# Patient Record
Sex: Male | Born: 1965 | Race: Black or African American | Hispanic: No | Marital: Married | Smoking: Former smoker
Health system: Southern US, Community
[De-identification: ages and names within clinical notes are randomized; demographics above are authoritative.]

## PROBLEM LIST (undated history)

## (undated) DIAGNOSIS — E785 Hyperlipidemia, unspecified: Secondary | ICD-10-CM

## (undated) DIAGNOSIS — K5792 Diverticulitis of intestine, part unspecified, without perforation or abscess without bleeding: Secondary | ICD-10-CM

## (undated) DIAGNOSIS — K589 Irritable bowel syndrome without diarrhea: Secondary | ICD-10-CM

## (undated) DIAGNOSIS — K219 Gastro-esophageal reflux disease without esophagitis: Secondary | ICD-10-CM

## (undated) DIAGNOSIS — G4730 Sleep apnea, unspecified: Secondary | ICD-10-CM

## (undated) DIAGNOSIS — M1990 Unspecified osteoarthritis, unspecified site: Secondary | ICD-10-CM

## (undated) DIAGNOSIS — I480 Paroxysmal atrial fibrillation: Secondary | ICD-10-CM

## (undated) DIAGNOSIS — F32A Depression, unspecified: Secondary | ICD-10-CM

## (undated) DIAGNOSIS — M87052 Idiopathic aseptic necrosis of left femur: Secondary | ICD-10-CM

## (undated) DIAGNOSIS — M87051 Idiopathic aseptic necrosis of right femur: Secondary | ICD-10-CM

## (undated) HISTORY — DX: Irritable bowel syndrome, unspecified: K58.9

## (undated) HISTORY — PX: HIP SURGERY: SHX245

## (undated) HISTORY — DX: Paroxysmal atrial fibrillation: I48.0

## (undated) HISTORY — PX: TONSILLECTOMY: SUR1361

---

## 2001-10-03 ENCOUNTER — Emergency Department (HOSPITAL_COMMUNITY): Admission: EM | Admit: 2001-10-03 | Discharge: 2001-10-03 | Payer: Self-pay | Admitting: Emergency Medicine

## 2002-08-16 ENCOUNTER — Emergency Department (HOSPITAL_COMMUNITY): Admission: EM | Admit: 2002-08-16 | Discharge: 2002-08-16 | Payer: Self-pay | Admitting: Emergency Medicine

## 2006-07-06 ENCOUNTER — Ambulatory Visit: Payer: Self-pay | Admitting: Gastroenterology

## 2006-07-11 ENCOUNTER — Ambulatory Visit (HOSPITAL_COMMUNITY): Admission: RE | Admit: 2006-07-11 | Discharge: 2006-07-11 | Payer: Self-pay | Admitting: Gastroenterology

## 2006-08-14 ENCOUNTER — Ambulatory Visit (HOSPITAL_COMMUNITY): Admission: RE | Admit: 2006-08-14 | Discharge: 2006-08-14 | Payer: Self-pay | Admitting: Gastroenterology

## 2006-08-14 ENCOUNTER — Ambulatory Visit: Payer: Self-pay | Admitting: Internal Medicine

## 2006-08-14 HISTORY — PX: SIGMOIDOSCOPY: SUR1295

## 2006-09-11 ENCOUNTER — Inpatient Hospital Stay (HOSPITAL_COMMUNITY): Admission: RE | Admit: 2006-09-11 | Discharge: 2006-09-15 | Payer: Self-pay | Admitting: General Surgery

## 2006-09-11 ENCOUNTER — Encounter (INDEPENDENT_AMBULATORY_CARE_PROVIDER_SITE_OTHER): Payer: Self-pay | Admitting: General Surgery

## 2006-09-11 HISTORY — PX: LAPAROSCOPIC SIGMOID COLECTOMY: SHX5928

## 2008-05-17 ENCOUNTER — Emergency Department (HOSPITAL_COMMUNITY): Admission: EM | Admit: 2008-05-17 | Discharge: 2008-05-17 | Payer: Self-pay | Admitting: Emergency Medicine

## 2008-08-27 ENCOUNTER — Emergency Department (HOSPITAL_COMMUNITY): Admission: EM | Admit: 2008-08-27 | Discharge: 2008-08-27 | Payer: Self-pay | Admitting: Emergency Medicine

## 2008-11-03 ENCOUNTER — Encounter: Payer: Self-pay | Admitting: Pulmonary Disease

## 2008-11-05 ENCOUNTER — Encounter: Payer: Self-pay | Admitting: Pulmonary Disease

## 2008-11-28 ENCOUNTER — Ambulatory Visit: Payer: Self-pay | Admitting: Pulmonary Disease

## 2008-11-28 DIAGNOSIS — K5732 Diverticulitis of large intestine without perforation or abscess without bleeding: Secondary | ICD-10-CM | POA: Insufficient documentation

## 2008-11-28 DIAGNOSIS — K219 Gastro-esophageal reflux disease without esophagitis: Secondary | ICD-10-CM | POA: Insufficient documentation

## 2008-11-28 DIAGNOSIS — J309 Allergic rhinitis, unspecified: Secondary | ICD-10-CM | POA: Insufficient documentation

## 2008-11-28 DIAGNOSIS — J45909 Unspecified asthma, uncomplicated: Secondary | ICD-10-CM | POA: Insufficient documentation

## 2008-12-01 ENCOUNTER — Encounter: Payer: Self-pay | Admitting: Pulmonary Disease

## 2009-01-29 ENCOUNTER — Ambulatory Visit: Payer: Self-pay | Admitting: Pulmonary Disease

## 2009-05-08 ENCOUNTER — Encounter: Payer: Self-pay | Admitting: Pulmonary Disease

## 2009-11-30 ENCOUNTER — Inpatient Hospital Stay (HOSPITAL_COMMUNITY): Admission: EM | Admit: 2009-11-30 | Discharge: 2009-12-06 | Payer: Self-pay | Admitting: Emergency Medicine

## 2010-03-31 ENCOUNTER — Emergency Department (HOSPITAL_COMMUNITY)
Admission: EM | Admit: 2010-03-31 | Discharge: 2010-03-31 | Payer: Self-pay | Source: Home / Self Care | Admitting: Emergency Medicine

## 2010-04-13 NOTE — Miscellaneous (Signed)
Summary: poor refill rate for symbicort  Clinical Lists Changes  have received compliance report from [facility]. pt is not taking symbicort compliantly

## 2010-05-12 ENCOUNTER — Emergency Department (HOSPITAL_COMMUNITY)
Admission: EM | Admit: 2010-05-12 | Discharge: 2010-05-12 | Disposition: A | Discharge status: Home / Self Care | Payer: Federal, State, Local not specified - PPO | Attending: Emergency Medicine | Admitting: Emergency Medicine

## 2010-05-12 DIAGNOSIS — R002 Palpitations: Secondary | ICD-10-CM | POA: Insufficient documentation

## 2010-05-12 DIAGNOSIS — K219 Gastro-esophageal reflux disease without esophagitis: Secondary | ICD-10-CM | POA: Insufficient documentation

## 2010-05-12 DIAGNOSIS — J45909 Unspecified asthma, uncomplicated: Secondary | ICD-10-CM | POA: Insufficient documentation

## 2010-05-27 LAB — LEGIONELLA ANTIGEN, URINE: Legionella Antigen, Urine: NEGATIVE

## 2010-05-27 LAB — LIPID PANEL
Cholesterol: 219 mg/dL — ABNORMAL HIGH (ref 0–200)
Total CHOL/HDL Ratio: 3.8 RATIO
VLDL: 28 mg/dL (ref 0–40)

## 2010-05-27 LAB — DIFFERENTIAL
Basophils Absolute: 0 10*3/uL (ref 0.0–0.1)
Basophils Relative: 0 % (ref 0–1)
Basophils Relative: 0 % (ref 0–1)
Eosinophils Absolute: 0 10*3/uL (ref 0.0–0.7)
Eosinophils Absolute: 0.1 10*3/uL (ref 0.0–0.7)
Eosinophils Relative: 0 % (ref 0–5)
Eosinophils Relative: 0 % (ref 0–5)
Eosinophils Relative: 1 % (ref 0–5)
Eosinophils Relative: 9 % — ABNORMAL HIGH (ref 0–5)
Lymphocytes Relative: 26 % (ref 12–46)
Lymphocytes Relative: 9 % — ABNORMAL LOW (ref 12–46)
Lymphs Abs: 1.4 10*3/uL (ref 0.7–4.0)
Lymphs Abs: 1.6 10*3/uL (ref 0.7–4.0)
Lymphs Abs: 1.9 10*3/uL (ref 0.7–4.0)
Lymphs Abs: 1.9 10*3/uL (ref 0.7–4.0)
Lymphs Abs: 2.7 10*3/uL (ref 0.7–4.0)
Monocytes Absolute: 0.1 10*3/uL (ref 0.1–1.0)
Monocytes Absolute: 0.4 10*3/uL (ref 0.1–1.0)
Monocytes Absolute: 0.8 10*3/uL (ref 0.1–1.0)
Monocytes Relative: 1 % — ABNORMAL LOW (ref 3–12)
Monocytes Relative: 4 % (ref 3–12)
Monocytes Relative: 5 % (ref 3–12)
Neutro Abs: 11 10*3/uL — ABNORMAL HIGH (ref 1.7–7.7)
Neutro Abs: 15.6 10*3/uL — ABNORMAL HIGH (ref 1.7–7.7)
Neutro Abs: 4.2 10*3/uL (ref 1.7–7.7)
Neutrophils Relative %: 65 % (ref 43–77)
Neutrophils Relative %: 80 % — ABNORMAL HIGH (ref 43–77)
Neutrophils Relative %: 88 % — ABNORMAL HIGH (ref 43–77)

## 2010-05-27 LAB — COMPREHENSIVE METABOLIC PANEL
ALT: 27 U/L (ref 0–53)
Albumin: 4.3 g/dL (ref 3.5–5.2)
Alkaline Phosphatase: 54 U/L (ref 39–117)
BUN: 7 mg/dL (ref 6–23)
Calcium: 9.6 mg/dL (ref 8.4–10.5)
Glucose, Bld: 135 mg/dL — ABNORMAL HIGH (ref 70–99)
Potassium: 4.2 mEq/L (ref 3.5–5.1)
Sodium: 141 mEq/L (ref 135–145)
Total Protein: 7.7 g/dL (ref 6.0–8.3)

## 2010-05-27 LAB — BLOOD GAS, ARTERIAL
Acid-Base Excess: 1.8 mmol/L (ref 0.0–2.0)
Drawn by: 21694
FIO2: 0.28 %
O2 Content: 2 L/min
O2 Saturation: 94.6 %
Patient temperature: 98.6
pO2, Arterial: 67.7 mmHg — ABNORMAL LOW (ref 80.0–100.0)

## 2010-05-27 LAB — BASIC METABOLIC PANEL
BUN: 12 mg/dL (ref 6–23)
BUN: 15 mg/dL (ref 6–23)
BUN: 17 mg/dL (ref 6–23)
BUN: 7 mg/dL (ref 6–23)
CO2: 26 mEq/L (ref 19–32)
CO2: 26 mEq/L (ref 19–32)
Calcium: 8.1 mg/dL — ABNORMAL LOW (ref 8.4–10.5)
Calcium: 8.4 mg/dL (ref 8.4–10.5)
Calcium: 8.7 mg/dL (ref 8.4–10.5)
Chloride: 103 mEq/L (ref 96–112)
Chloride: 108 mEq/L (ref 96–112)
Chloride: 108 mEq/L (ref 96–112)
Creatinine, Ser: 0.92 mg/dL (ref 0.4–1.5)
Creatinine, Ser: 1.1 mg/dL (ref 0.4–1.5)
GFR calc Af Amer: >60 mL/min (ref >60)
GFR calc Af Amer: >60 mL/min (ref >60)
GFR calc non Af Amer: >60 mL/min (ref >60)
Glucose, Bld: 136 mg/dL — ABNORMAL HIGH (ref 70–99)
Glucose, Bld: 86 mg/dL (ref 70–99)
Potassium: 3.7 mEq/L (ref 3.5–5.1)
Potassium: 4 mEq/L (ref 3.5–5.1)
Sodium: 139 mEq/L (ref 135–145)
Sodium: 139 mEq/L (ref 135–145)

## 2010-05-27 LAB — GLUCOSE, CAPILLARY
Glucose-Capillary: 110 mg/dL — ABNORMAL HIGH (ref 70–99)
Glucose-Capillary: 139 mg/dL — ABNORMAL HIGH (ref 70–99)
Glucose-Capillary: 71 mg/dL (ref 70–99)
Glucose-Capillary: 90 mg/dL (ref 70–99)

## 2010-05-27 LAB — MRSA PCR SCREENING: MRSA by PCR: NEGATIVE

## 2010-05-27 LAB — CBC
HCT: 41.5 % (ref 39.0–52.0)
HCT: 43.7 % (ref 39.0–52.0)
HCT: 43.8 % (ref 39.0–52.0)
Hemoglobin: 14 g/dL (ref 13.0–17.0)
Hemoglobin: 14.6 g/dL (ref 13.0–17.0)
MCH: 30.2 pg (ref 26.0–34.0)
MCH: 30.6 pg (ref 26.0–34.0)
MCHC: 33.3 g/dL (ref 30.0–36.0)
MCHC: 33.6 g/dL (ref 30.0–36.0)
MCHC: 33.8 g/dL (ref 30.0–36.0)
MCV: 90.1 fL (ref 78.0–100.0)
MCV: 90.8 fL (ref 78.0–100.0)
MCV: 91.1 fL (ref 78.0–100.0)
MCV: 91.8 fL (ref 78.0–100.0)
Platelets: 259 10*3/uL (ref 150–400)
Platelets: 284 10*3/uL (ref 150–400)
Platelets: 300 10*3/uL (ref 150–400)
RBC: 4.19 MIL/uL — ABNORMAL LOW (ref 4.22–5.81)
RBC: 4.58 MIL/uL (ref 4.22–5.81)
RDW: 13.8 % (ref 11.5–15.5)
RDW: 13.9 % (ref 11.5–15.5)
RDW: 14.1 % (ref 11.5–15.5)
WBC: 12.5 10*3/uL — ABNORMAL HIGH (ref 4.0–10.5)
WBC: 18.1 10*3/uL — ABNORMAL HIGH (ref 4.0–10.5)
WBC: 7.1 10*3/uL (ref 4.0–10.5)

## 2010-05-27 LAB — PROTIME-INR: INR: 1 (ref 0.00–1.49)

## 2010-05-27 LAB — RAPID URINE DRUG SCREEN, HOSP PERFORMED
Benzodiazepines: NOT DETECTED
Cocaine: NOT DETECTED
Opiates: POSITIVE — AB
Tetrahydrocannabinol: NOT DETECTED

## 2010-05-27 LAB — MAGNESIUM: Magnesium: 2.3 mg/dL (ref 1.5–2.5)

## 2010-05-27 LAB — URINALYSIS, ROUTINE W REFLEX MICROSCOPIC
Bilirubin Urine: NEGATIVE
Glucose, UA: NEGATIVE mg/dL
Hgb urine dipstick: NEGATIVE
Specific Gravity, Urine: >1.03 — ABNORMAL HIGH (ref 1.005–1.030)
pH: 5.5 (ref 5.0–8.0)

## 2010-05-27 LAB — TSH: TSH: 0.59 u[IU]/mL (ref 0.350–4.500)

## 2010-06-11 ENCOUNTER — Ambulatory Visit (HOSPITAL_COMMUNITY)
Admission: RE | Admit: 2010-06-11 | Discharge: 2010-06-11 | Disposition: A | Discharge status: Home / Self Care | Payer: Federal, State, Local not specified - PPO | Source: Ambulatory Visit | Attending: Preventative Medicine | Admitting: Preventative Medicine

## 2010-06-11 ENCOUNTER — Other Ambulatory Visit (HOSPITAL_COMMUNITY): Payer: Self-pay | Admitting: Preventative Medicine

## 2010-06-11 DIAGNOSIS — R1032 Left lower quadrant pain: Secondary | ICD-10-CM | POA: Insufficient documentation

## 2010-06-11 DIAGNOSIS — R197 Diarrhea, unspecified: Secondary | ICD-10-CM | POA: Insufficient documentation

## 2010-06-11 DIAGNOSIS — M87059 Idiopathic aseptic necrosis of unspecified femur: Secondary | ICD-10-CM | POA: Insufficient documentation

## 2010-06-11 DIAGNOSIS — R110 Nausea: Secondary | ICD-10-CM

## 2010-06-11 DIAGNOSIS — R52 Pain, unspecified: Secondary | ICD-10-CM

## 2010-06-11 DIAGNOSIS — R509 Fever, unspecified: Secondary | ICD-10-CM

## 2010-06-11 DIAGNOSIS — K5730 Diverticulosis of large intestine without perforation or abscess without bleeding: Secondary | ICD-10-CM | POA: Insufficient documentation

## 2010-06-11 MED ORDER — IOHEXOL 300 MG/ML  SOLN
100.0000 mL | Freq: Once | INTRAMUSCULAR | Status: AC | PRN
  Administered 2010-06-11: 100 mL via INTRAVENOUS

## 2010-06-21 LAB — BASIC METABOLIC PANEL
Calcium: 9.8 mg/dL (ref 8.4–10.5)
Chloride: 103 mEq/L (ref 96–112)
Creatinine, Ser: 1.08 mg/dL (ref 0.4–1.5)
GFR calc Af Amer: >60 mL/min (ref >60)
Sodium: 138 mEq/L (ref 135–145)

## 2010-06-21 LAB — POCT CARDIAC MARKERS
CKMB, poc: <1 ng/mL — ABNORMAL LOW (ref 1.0–8.0)
Myoglobin, poc: 79.6 ng/mL (ref 12–200)
Troponin i, poc: <0.05 ng/mL (ref 0.00–0.09)

## 2010-06-21 LAB — CBC
Hemoglobin: 14.5 g/dL (ref 13.0–17.0)
RBC: 4.6 MIL/uL (ref 4.22–5.81)
WBC: 12.3 10*3/uL — ABNORMAL HIGH (ref 4.0–10.5)

## 2010-06-21 LAB — DIFFERENTIAL
Lymphs Abs: 3.3 10*3/uL (ref 0.7–4.0)
Monocytes Relative: 6 % (ref 3–12)
Neutro Abs: 8 10*3/uL — ABNORMAL HIGH (ref 1.7–7.7)
Neutrophils Relative %: 65 % (ref 43–77)

## 2010-07-27 NOTE — Op Note (Signed)
NAME:  Robert Gordon, Robert Gordon              ACCOUNT NO.:  [id number]   MEDICAL RECORD NO.:  [id number]          PATIENT TYPE:  INP   LOCATION:  A224                          FACILITY:  [facility]   PHYSICIAN:  Dalia Heading, M.D.  DATE OF BIRTH:  1965/12/19   DATE OF PROCEDURE:  09/11/2006  DATE OF DISCHARGE:                               OPERATIVE REPORT   PREOPERATIVE DIAGNOSIS:  Recurrent sigmoid diverticulitis.   POSTOPERATIVE DIAGNOSIS:  Recurrent sigmoid diverticulitis.   PROCEDURE:  Laparoscopic sigmoid colectomy.   SURGEON:  Dr. Franky Macho   ANESTHESIA:  General endotracheal.   INDICATIONS:  The patient is a 45 year old white male who presents with  recurrent sigmoid diverticulitis.  Risks and benefits of the procedure  including bleeding, infection, and the possibly of a blood transfusion  were fully explained to the patient, gave informed consent.   PROCEDURE NOTE:  The patient was placed in the low lithotomy position  after induction of general endotracheal anesthesia.  The abdomen was  prepped and draped in the usual sterile technique with Betadine.  Surgical site confirmation was performed.   A Pfannenstiel incision was made.  A low longitudinal incision was made  in the peritoneal cavity.  A GelPort was then inserted.  An additional  11-mm trocars placed in the suprapubic region and 5-mm trocars placed  left lower quadrant region.  The abdomen was insufflated to 16 mmHg  pressure.  The liver was inspected and noted to be within normal limits.  The nasogastric tube was noted be its appropriate position of the  stomach.  The left colon and sigmoid colon regions were mobilized along  the peritoneal reflection.  The peritoneal reflection was sharply  dissected using Endoshears as well as bluntly dissected using a hand.  The splenic flexure was taken down using Endoshears and Bovie  electrocautery.  Care was taken to avoid any injury to the spleen.  The  left ureter was  identified, retracted from the operative field.  The  mobilization was advanced fully from the splenic flexure down to the  rectum.  Once the colon was fully mobilized, it was inspected and  sigmoid diverticulosis was seen.  No diverticuli were seen in the base.  The proximal descending colon or rectal region.  The sigmoid colon was  then brought through the Pfannenstiel incision.  A GIA stapler was  placed proximally on the sigmoid colon and distally at the colorectal  juncture.  The mesentery was divided using the LigaSure.  The specimens  then removed from the operative field sent to pathology. Further  examination hardened, thickened area of mesentery was noted distally,  consistent with the patient's previous episodes of diverticulitis.  A  side-to-side colon anastomosis was then performed using a GIA stapler.  The colotomy was closed using a TA-60 stapler.  Staple line was  bolstered using 3-0 silk sutures.  Adipose tissue was then placed over  the staple line and secured in place using a 3-0 silk suture.  The  pelvis and abdominal cavity were copiously irrigated with gentamicin and  normal saline.  All operating room personnel changed  their gloves.   All fluid was then evacuated from the abdominal cavity.  All trocars  were removed.  The supraumbilical fascia was reapproximated using an 0  Vicryl interrupted suture.  The peritoneal layer was reapproximated  using a running 2-0 Vicryl suture.  The Pfannenstiel fascia was  reapproximated using 0 Vicryl interrupted sutures.  Subcutaneous layer  was irrigated normal saline and skin was closed using staples.  The  other two incisions were closed using staples.  0.5% Sensorcaine was  instilled in the surrounding incisions.  Betadine ointment and dry  sterile dressings were applied.   All tape needle counts correct at end the procedure.  The patient was  extubated in the operating room went back to recovery room awake in  stable  condition.  Complications none.   SPECIMEN:  Sigmoid colon.  Blood loss minimal.      Dalia Heading, M.D.  Electronically Signed     MAJ/MEDQ  D:  09/11/2006  T:  09/11/2006  Job:  [id number]   cc:   Samuel Jester  Fax: [phone]   Kassie Mends, M.D.  [address]  [place] , [place] [address]

## 2010-07-27 NOTE — Op Note (Signed)
NAMECHISTOPHER, MANGINO              ACCOUNT NO.:  [id number]   MEDICAL RECORD NO.:  [id number]          PATIENT TYPE:  AMB   LOCATION:  DAY                           FACILITY:  [facility]   PHYSICIAN:  Kassie Mends, M.D.      DATE OF BIRTH:  1965/08/08   DATE OF PROCEDURE:  08/14/2006  DATE OF DISCHARGE:                               OPERATIVE REPORT   REFERRING PHYSICIAN:  Dr. Samuel Jester.   PROCEDURE:  Sigmoidoscopy.   INDICATION FOR EXAM:  Robert Gordon is a 45 year old male who presents with  recurrent diverticulitis.  He continues to have abdominal pain.  He had  a with a CT scan in April 2008 which showed inflammatory changes in his  sigmoid colon.  The sigmoidoscopy is done to further evaluate these  findings.   FINDINGS:  1. Multiple diverticula seen beginning 20 cm from the anal verge and      extending to 50 cm from the anal verge.  The scope was passed to      the distal transverse colon.  Otherwise no polyps, masses,      inflammatory changes or AVMs seen.  2. Normal retroflexed view of the rectum.   RECOMMENDATIONS:  1. High fiber diet.  The patient is instructed to avoid constipation.      He is given a handout on high-fiber diet and diverticulosis.  2. He is given a prescription for Cipro and Flagyl in case he develops      a diverticulitis.  He is asked to call me prior to initiating any      antibiotic therapy.  3. He is scheduled to see Dr. Franky Macho in 7-10 days to discuss      sigmoid colectomy.  He understands that other options for managing      diverticulitis include antibiotic therapy.  He has elected to      discuss sigmoid colectomy with Dr. Franky Macho.  He prefers to      have the procedure done laparoscopically.  4. He also has diarrhea after eating.  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  He is instructed to      go to [facility] and pick up a supply of Align and take a      dose daily.   MEDICATIONS:  1. Demerol 50 mg IV.  2. Versed 5 mg IV.   PROCEDURE TECHNIQUE:  Physical exam was performed.  Informed consent was  obtained from the patient after explaining the benefits, risks and  alternatives to the procedure.  The patient connected to the monitor and  placed in the left lateral position.  Continuous oxygen was provided by  nasal cannula and IV medicine administered through an indwelling  cannula.  After administration of sedation and rectal exam, the  patient's rectum was intubated and scope was advanced under direct  visualization to the distal transverse colon.  The scope was  subsequently  withdrawn by carefully examining color, texture, anatomy and integrity  of the mucosa on the way  out.  The exam was somewhat limited by formed  stool in the descending colon and the distal transverse colon.  The  patient was recovered in endoscopy and discharged home in satisfactory  condition.      Kassie Mends, M.D.  Electronically Signed     SM/MEDQ  D:  08/14/2006  T:  08/14/2006  Job:  [id number]   cc:   Samuel Jester  Fax: [phone]

## 2010-07-27 NOTE — H&P (Signed)
NAME:  Robert Gordon, Robert Gordon              ACCOUNT NO.:  [id number]   MEDICAL RECORD NO.:  [id number]          PATIENT TYPE:  AMB   LOCATION:                                FACILITY:  [facility]   PHYSICIAN:  Dalia Heading, M.D.  DATE OF BIRTH:  03/13/1966   DATE OF ADMISSION:  08/29/2006  DATE OF DISCHARGE:  LH                              HISTORY & PHYSICAL   CHIEF COMPLAINT:  Recurrent sigmoid diverticulitis.   HISTORY OF PRESENT ILLNESS:  The patient is a 45 year old white male.  He is referred for evaluation and treatment of recurrent sigmoid  diverticulitis.  He has had three episodes over the last few years.  His  last episode was in April 2008.  A colonoscopy and CT scan of the  abdomen and pelvis revealed sigmoid diverticulitis.  He is currently  feeling okay.  No fever, chills, diarrhea or constipation have been  noted.   PAST MEDICAL HISTORY:  Extrinsic allergies.   PAST SURGICAL HISTORY:  1. Vasectomy.  2. Tonsillectomy.   CURRENT MEDICATIONS:  Albuterol nebulizer treatments.   ALLERGIES:  No known drug allergies.   REVIEW OF SYSTEMS:  The patient smokes 1/2 pack of cigarettes a day.  Denies any alcohol use.  Denies any other cardiopulmonary difficulties  or bleeding disorders.   PHYSICAL EXAMINATION:  GENERAL:  The patient is a well-developed and  well-nourished white male, in no acute distress.  LUNGS:  Clear to auscultation with good breath sounds bilaterally.  HEART:  Examination reveals a regular rate and rhythm without S3, S4 or  murmurs.  ABDOMEN:  Soft, nontender, non-distended. No hepatosplenomegaly or  herniae are identified.   IMPRESSION:  Recurrent sigmoid diverticulitis.   PLAN:  The patient is scheduled for a laparoscopic sigmoid colectomy on  September 11, 2006.  The risks and benefits of the procedure, including  bleeding, infection, pain and the possibility of a blood transfusion  were fully explained to the patient who gave an informed consent.      Dalia Heading, M.D.  Electronically Signed     MAJ/MEDQ  D:  08/29/2006  T:  08/29/2006  Job:  [id number]   cc:   Kassie Mends, M.D.  [address]  [place] , [place] [address]   Samuel Jester  Fax: [phone]

## 2010-07-27 NOTE — Discharge Summary (Signed)
NAME:  Robert Gordon, Robert Gordon NO.:  [id number]   MEDICAL RECORD NO.:  [id number]          PATIENT TYPE:  INP   LOCATION:  A319                          FACILITY:  [facility]   PHYSICIAN:  Dalia Heading, M.D.  DATE OF BIRTH:  Jul 15, 1965   DATE OF ADMISSION:  09/11/2006  DATE OF DISCHARGE:  07/04/2008LH                               DISCHARGE SUMMARY   HOSPITAL COURSE:  The patient is a 45 year old white male with a history  of recurrent sigmoid diverticulitis who presented to [facility]  for a laparoscopic sigmoid colectomy.  This was performed on 09/11/2006.  He tolerated the procedure well.  His postoperative course has been  unremarkable.  His diet was advanced without difficulty once his bowel  function returned.   The patient is being discharged home on 09/15/1998 in good and improving  condition.   DISCHARGE INSTRUCTIONS:  The patient is to follow up with Dr. Franky Macho on 09/29/2006.   DISCHARGE MEDICATIONS:  1. Include Percocet one to two tablets p.o. q.4 h. p.r.n. pain.  2. Albuterol inhalers as ordered.   PRINCIPAL DIAGNOSES:  1. Sigmoid diverticulitis.  2. Extrinsic allergies.   PRINCIPAL PROCEDURE:  Laparoscopic sigmoid colectomy on 09/11/2006.      Dalia Heading, M.D.  Electronically Signed     MAJ/MEDQ  D:  09/15/2006  T:  09/15/2006  Job:  [id number]   cc:   Kassie Mends, M.D.  [address]  [place] , [place] [address]   Samuel Jester  Fax: [phone]

## 2010-07-30 NOTE — Consult Note (Signed)
NAME:  Robert Gordon, Robert Gordon              ACCOUNT NO.:  [id number]   MEDICAL RECORD NO.:  [id number]          PATIENT TYPE:  AMB   LOCATION:                                FACILITY:  [facility]   PHYSICIAN:  Kassie Mends, M.D.      DATE OF BIRTH:  [birth year]   DATE OF CONSULTATION:  07/06/2006  DATE OF DISCHARGE:                                 CONSULTATION   GI CONSULTATION NOTE   REASON FOR CONSULTATION:  Recurrent diverticulitis.   HISTORY OF PRESENT ILLNESS:  Robert Gordon is a 45 year old gentleman who  presents for recurrent diverticulitis and to schedule colonoscopy.  He  states his first documented episode was back in June of 2007.  He  presented to the emergency department at that time with abdominal pain.  Findings were consistent with sigmoid diverticulitis.  Recently he was  seen by his PCP in March and was complaining of recurrent abdominal  pain.  He is empirically treated with a 10 day course of Cipro.  He  states the symptoms really have not resolved.  He continues to have  abdominal bloating, left lower quadrant abdominal pain and constipation.  Denies any melena or rectal bleeding.  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  About 1 year ago his bowel movements changed and now he is  having post-prandial bloating.  He has very small hard stools 1-2 times  a day, and maybe every third day would have what he considers a good  bowel movement with relief of his bloating.  Again for the past six  weeks he has intermittent left lower quadrant abdominal pain, no fever  or chills, having increased difficulty with constipation.  He has a  history of GERD, rarely has any heartburn.  He is on Pantoprazole.  He  has never had a colonoscopy.  He states he was seen by [place]. Laure[place] his June 2007 attack of diverticulitis and  was going to be scheduled for a colonoscopy but she then transferred out  of town.   CURRENT MEDICATIONS:  Ipratropium/albuterol nebulizer b.i.d.  Symbicort p.r.n.  Albuterol p.r.n.  Singulair 10 mg q.h.s.  Zyrtec 10 mg once a day.  Pantoprazole 40 mg once a day.  Fluticasone nasal spray daily.   ALLERGIES:  No known drug allergies.   PAST MEDICAL HISTORY:  Allergies, history of diverticulitis, history of  asthma.  He was hospitalized in December 2007 for a severe asthma  attack.   PAST SURGICAL HISTORY:  He has had a vasectomy, tonsillectomy, surgery  for bone spur.   FAMILY HISTORY:  Negative for colorectal cancer or colon polyps.   SOCIAL HISTORY:  He has been married for 10 years.  He has two  daughters, two sons and a Museum/gallery conservator.  He works for the [employer].  He smokes less than a half pack of cigarettes daily.  Occasional he consumes alcohol.  He is retired from [employer]  He served  for over 16 years.   REVIEW OF SYSTEMS:  See HPI for GI consultation.  Denies weight loss.  Cardiopulmonary:  No chest pain or shortness of breath, palpitations,  chronic cough.  GU:  No dysuria or hematuria.   PHYSICAL EXAMINATION:  Weight 198.  Height 5 foot 9.  Temp 98.5.  Blood  pressure 138/82.  Pulse 95.  General:  Pleasant, well-nourished, well-  developed gentleman in no acute distress.  Skin warm and dry, no  jaundice.  HEENT:  Sclera nonicteric.  Oropharyngeal mucosa moist and  pink.  No lesions, erythema or exudate.  No lymphadenopathy or  thyromegaly.  Chest:  Lungs are clear to auscultation.  Cardiac exam  reveals regular rate and rhythm.  Normal S1, S2.  No murmurs, rubs or  gallops.  Abdomen:  Positive bowel sounds.  Abdomen is soft,  nondistended.  He has mild tenderness throughout the lower mid to left  lower quadrant to deep palpation.  No organomegaly or masses.  No  rebound tenderness or guarding.  No abdominal bruits or hernias.  Extremities:  No  edema.   IMPRESSION:  Robert Gordon is a 45 year old gentleman with at least one  documented episode of sigmoid diverticulitis in June 2007.  He has had  symptoms over the last one year of postprandial abdominal bloating or  constipation.  In March of this year he had recurrent left lower  quadrant abdominal pain which really has not resolved after a 10 day  course of Cipro and IM Rocephin (single dose).  I have discussed the  case with Dr. Cira Servant.  We will go ahead and repeat his CT to rule out any  recurrent or small draining diverticulitis or even an abscess.  We will  plan on colonoscopy after that.   PLAN:  1. CBC MET-7.  2. CT abdomen and pelvis with IV normal contrast.  3. He will have a colonoscopy at a later date after initial workup has      been done.   I would like to thank Dr. Samuel Jester for allowing [place] to take part in  the care of this patient.      Tana Coast, P.A.      Kassie Mends, M.D.  Electronically Signed    LL/MEDQ  D:  07/06/2006  T:  07/06/2006  Job:  [id number]   cc:   Samuel Jester  Fax: [phone]

## 2010-09-24 ENCOUNTER — Emergency Department (HOSPITAL_COMMUNITY)
Admission: EM | Admit: 2010-09-24 | Discharge: 2010-09-24 | Disposition: A | Discharge status: Home / Self Care | Payer: Federal, State, Local not specified - PPO | Attending: Emergency Medicine | Admitting: Emergency Medicine

## 2010-09-24 DIAGNOSIS — J45909 Unspecified asthma, uncomplicated: Secondary | ICD-10-CM | POA: Insufficient documentation

## 2010-09-24 DIAGNOSIS — F17200 Nicotine dependence, unspecified, uncomplicated: Secondary | ICD-10-CM | POA: Insufficient documentation

## 2010-09-24 HISTORY — DX: Diverticulitis of intestine, part unspecified, without perforation or abscess without bleeding: K57.92

## 2010-09-24 HISTORY — DX: Gastro-esophageal reflux disease without esophagitis: K21.9

## 2010-09-24 HISTORY — DX: Unspecified osteoarthritis, unspecified site: M19.90

## 2010-09-24 MED ORDER — ALBUTEROL SULFATE HFA 108 (90 BASE) MCG/ACT IN AERS: 1.0000 | INHALATION_SPRAY | Freq: Four times a day (QID) | RESPIRATORY_TRACT | Status: DC | PRN

## 2010-09-24 MED ORDER — ALBUTEROL SULFATE (5 MG/ML) 0.5% IN NEBU
2.5000 mg | INHALATION_SOLUTION | Freq: Once | RESPIRATORY_TRACT | Status: AC
  Administered 2010-09-24: 2.5 mg via RESPIRATORY_TRACT

## 2010-09-24 MED ORDER — PREDNISONE 20 MG PO TABS
60.0000 mg | ORAL_TABLET | Freq: Once | ORAL | Status: AC
  Administered 2010-09-24: 60 mg via ORAL
  Filled 2010-09-24: qty 3

## 2010-09-24 MED ORDER — IPRATROPIUM BROMIDE 0.02 % IN SOLN
0.5000 mg | Freq: Once | RESPIRATORY_TRACT | Status: AC
  Administered 2010-09-24: 0.5 mg via RESPIRATORY_TRACT
  Filled 2010-09-24: qty 2.5

## 2010-09-24 MED ORDER — PREDNISONE 10 MG PO TABS: 20.0000 mg | ORAL_TABLET | Freq: Every day | ORAL | Status: AC

## 2010-09-24 MED ORDER — ALBUTEROL SULFATE (2.5 MG/3ML) 0.083% IN NEBU
INHALATION_SOLUTION | RESPIRATORY_TRACT | Status: AC
  Administered 2010-09-24: 2.5 mg via RESPIRATORY_TRACT
  Filled 2010-09-24: qty 3

## 2010-09-24 NOTE — ED Provider Notes (Addendum)
History     Chief Complaint  Patient presents with  . Asthma   Patient is a 45 y.o. male presenting with asthma. The history is provided by the patient and the spouse.  Asthma This is a recurrent problem. The current episode started more than 2 days ago. The problem occurs daily. The problem has been gradually worsening. Associated symptoms include chest pain and shortness of breath. Pertinent negatives include no abdominal pain and no headaches. The symptoms are aggravated by nothing. The symptoms are relieved by nothing (WAS TRYING NEBULIZER AT HOME BUT NOT HELPING). The treatment provided no relief.   HAS HOME NUBULIZER BUT NOT HELPING OUT OF INHALER. USUSALLY NEEDS STEROIDS. [facility] KNOWN HX OF ASTHMA.  Past Medical History  Diagnosis Date  . Asthma   . Diverticulitis   . GERD (gastroesophageal reflux disease)   . Arthritis     Past Surgical History  Procedure Date  . Tonsillectomy   . Sigmoid resection / rectopexy     No family history on file.  History  Substance Use Topics  . Smoking status: Current Everyday Smoker    Types: Cigarettes  . Smokeless tobacco: Not on file  . Alcohol Use: No      Review of Systems  Constitutional: Negative for fever.  HENT: Negative for congestion, sneezing and neck pain.   Eyes: Negative for redness.  Respiratory: Positive for chest tightness, shortness of breath and wheezing.   Cardiovascular: Positive for chest pain. Negative for palpitations.  Gastrointestinal: Negative for nausea, vomiting and abdominal pain.  Genitourinary: Negative for hematuria.  Musculoskeletal: Negative for back pain.  Skin: Negative for rash.  Neurological: Negative for headaches.  Hematological: Negative for adenopathy.    Physical Exam  BP 118/80  Pulse 96  Temp(Src) 97.6 F (36.4 C) (Oral)  Resp 26  Ht 5\' 9"  (1.753 m)  Wt 212 lb (96.163 kg)  BMI 31.31 kg/m2  SpO2 99%  Physical Exam  Constitutional: He is oriented to person,  place, and time. He appears well-developed and well-nourished.  HENT:  Head: Normocephalic and atraumatic.  Mouth/Throat: Oropharynx is clear and moist.  Eyes: Conjunctivae and EOM are normal. Pupils are equal, round, and reactive to light.  Neck: Normal range of motion. Neck supple.  Cardiovascular: Normal rate, regular rhythm and normal heart sounds.   No murmur heard. Pulmonary/Chest: He is in respiratory distress. He has wheezes. He has no rales. He exhibits no tenderness.  Abdominal: Soft. Bowel sounds are normal. There is no tenderness.  Musculoskeletal: Normal range of motion. He exhibits no edema.  Lymphadenopathy:    He has no cervical adenopathy.  Neurological: He is alert and oriented to person, place, and time. No cranial nerve deficit.  Skin: Skin is warm and dry. No rash noted.    ED Course  Procedures  MDM RX IN ED WIT NEBULIZER WIT ATROVENT AND ALBUTEROL AND WHEEZING RESOLVED. ALSO GIVEN PREDNISONE.       Shelda Jakes, MD 09/24/10 0454  Shelda Jakes, MD 09/24/10 [id number]

## 2010-09-24 NOTE — ED Notes (Signed)
Has been sick w. Asthma since last sat.

## 2010-12-28 LAB — BASIC METABOLIC PANEL
BUN: 6
CO2: 29
Calcium: 8.4
Creatinine, Ser: 0.91
GFR calc non Af Amer: >60
Glucose, Bld: 103 — ABNORMAL HIGH
Sodium: 141

## 2010-12-28 LAB — CBC
MCHC: 34
Platelets: 204
RDW: 13.4

## 2010-12-28 LAB — DIFFERENTIAL
Basophils Absolute: 0
Basophils Relative: 0
Lymphocytes Relative: 23
Monocytes Absolute: 0.8 — ABNORMAL HIGH
Neutro Abs: 6.5
Neutrophils Relative %: 67

## 2010-12-29 LAB — BASIC METABOLIC PANEL
BUN: 7
Chloride: 107
GFR calc Af Amer: >60
GFR calc non Af Amer: >60
Potassium: 3.8

## 2010-12-29 LAB — CBC
HCT: 45.5
MCV: 88.6
Platelets: 229
RBC: 5.14
WBC: 6.8

## 2010-12-29 LAB — TYPE AND SCREEN: ABO/RH(D): O POS

## 2011-01-09 ENCOUNTER — Emergency Department (HOSPITAL_COMMUNITY)
Admission: EM | Admit: 2011-01-09 | Discharge: 2011-01-09 | Disposition: A | Discharge status: Home / Self Care | Payer: Federal, State, Local not specified - PPO | Attending: Emergency Medicine | Admitting: Emergency Medicine

## 2011-01-09 ENCOUNTER — Encounter (HOSPITAL_COMMUNITY): Payer: Self-pay

## 2011-01-09 DIAGNOSIS — M129 Arthropathy, unspecified: Secondary | ICD-10-CM | POA: Insufficient documentation

## 2011-01-09 DIAGNOSIS — K219 Gastro-esophageal reflux disease without esophagitis: Secondary | ICD-10-CM | POA: Insufficient documentation

## 2011-01-09 DIAGNOSIS — F17200 Nicotine dependence, unspecified, uncomplicated: Secondary | ICD-10-CM | POA: Insufficient documentation

## 2011-01-09 DIAGNOSIS — J45901 Unspecified asthma with (acute) exacerbation: Secondary | ICD-10-CM

## 2011-01-09 DIAGNOSIS — J45909 Unspecified asthma, uncomplicated: Secondary | ICD-10-CM | POA: Insufficient documentation

## 2011-01-09 MED ORDER — SODIUM CHLORIDE 0.9 % IN NEBU
INHALATION_SOLUTION | RESPIRATORY_TRACT | Status: AC
  Administered 2011-01-09: 3 mL
  Filled 2011-01-09: qty 15

## 2011-01-09 MED ORDER — METHYLPREDNISOLONE SODIUM SUCC 125 MG IJ SOLR
125.0000 mg | Freq: Once | INTRAMUSCULAR | Status: AC
  Administered 2011-01-09: 125 mg via INTRAMUSCULAR
  Filled 2011-01-09: qty 2

## 2011-01-09 MED ORDER — PREDNISONE 10 MG PO TABS: 20.0000 mg | ORAL_TABLET | Freq: Every day | ORAL | Status: AC

## 2011-01-09 MED ORDER — ALBUTEROL (5 MG/ML) CONTINUOUS INHALATION SOLN
15.0000 mg | INHALATION_SOLUTION | RESPIRATORY_TRACT | Status: AC
  Administered 2011-01-09: 15 mg via RESPIRATORY_TRACT
  Filled 2011-01-09: qty 20

## 2011-01-09 MED ORDER — IPRATROPIUM BROMIDE 0.02 % IN SOLN
0.5000 mg | Freq: Once | RESPIRATORY_TRACT | Status: AC
  Administered 2011-01-09: 0.5 mg via RESPIRATORY_TRACT
  Filled 2011-01-09: qty 2.5

## 2011-01-09 MED ORDER — PREDNISONE 10 MG PO TABS: ORAL_TABLET | ORAL | Status: DC

## 2011-01-09 NOTE — ED Notes (Signed)
Pt states his asthma has been acting up since last night. Pt states he is short of breath and his breathing treatments at home are not working. Pt is wheezing audibly.

## 2011-01-09 NOTE — ED Notes (Signed)
Pt presents with SOB and  wheezing. Pt has tried to take inhalers, nebs, prednisone and all have worked for a short time. Today nothing is working. NAD at this time.

## 2011-01-09 NOTE — ED Provider Notes (Signed)
History     CSN: [id number] Arrival date & time: 01/09/2011  1:06 PM   First MD Initiated Contact with Patient 01/09/11 1421      Chief Complaint  Patient presents with  . Shortness of Breath  . Wheezing  . Asthma    (Consider location/radiation/quality/duration/timing/severity/associated sxs/prior treatment) HPI  Patient relates he has a history of reactive airway disease and he states 2 nights ago he started having trouble breathing and felt okay yesterday however he had difficulty last night from midnight to 4:30 AM then he was able to rest he states it and then he started getting more short of breath. He took a couple prednisone at home he does not know the milligram strength of the prednisone. He denies coughing fever rhinorrhea sore throat vomiting or diarrhea. He states nothing makes them feel better. He states today his nebulizer is not helping. He states he feels very short of breath.   Past Medical History  Diagnosis Date  . Asthma   . Diverticulitis   . GERD (gastroesophageal reflux disease)   . Arthritis     Past Surgical History  Procedure Date  . Tonsillectomy   . Sigmoid resection / rectopexy     History reviewed. No pertinent family history.  History  Substance Use Topics  . Smoking status: Current Some Day Smoker    Types: Cigarettes  . Smokeless tobacco: Not on file  . Alcohol Use: No   patient employed    Review of Systems  All other systems reviewed and are negative.    Allergies  Dm  Home Medications   Current Outpatient Rx  Name Route Sig Dispense Refill  . ALBUTEROL SULFATE HFA 108 (90 BASE) MCG/ACT IN AERS Inhalation Inhale 1-2 puffs into the lungs every 6 (six) hours as needed for wheezing. 1 Inhaler 0  . ALBUTEROL SULFATE (2.5 MG/3ML) 0.083% IN NEBU Nebulization Take 2.5 mg by nebulization every 6 (six) hours as needed. For shortness of breath    . BUDESONIDE-FORMOTEROL FUMARATE 160-4.5 MCG/ACT IN AERO Inhalation Inhale 2 puffs  into the lungs 2 (two) times daily. Pt currently out of medication     . DIPHENHYDRAMINE HCL 25 MG PO CAPS Oral Take 25 mg by mouth every 6 (six) hours as needed. For allergies     . PRIMATENE ASTHMA PO Oral Take 2 tablets by mouth every 8 (eight) hours as needed. For shortness of breath     . GUAIFENESIN 100 MG/5ML PO SOLN Oral Take 5 mLs by mouth every 4 (four) hours as needed. For cough     . IBUPROFEN 200 MG PO TABS Oral Take 400 mg by mouth every 6 (six) hours as needed. For pain     . PREDNISONE 10 MG PO TABS Oral Take 2 tablets (20 mg total) by mouth daily. 10 tablet 0  . PREDNISONE 10 MG PO TABS  Take 3 po QD x 2d starting tomorrow, then 2 po QD x 3d then 1 po QD x 3d  15 tablet 0  . UNABLE TO FIND        BP 111/80  Pulse 101  Temp(Src) 97.9 F (36.6 C) (Oral)  Resp 20  Ht 5\' 9"  (1.753 m)  Wt 194 lb (87.998 kg)  BMI 28.65 kg/m2  SpO2 97%  Vital signs normal except for mild tachycardia  Physical Exam  Constitutional: He is oriented to person, place, and time. He appears well-developed and well-nourished.  HENT:  Head: Normocephalic and atraumatic.  Nose: Nose  normal.  Mouth/Throat: Oropharynx is clear and moist.  Eyes: Conjunctivae and EOM are normal. Pupils are equal, round, and reactive to light.  Neck: Normal range of motion. Neck supple.  Cardiovascular: Normal rate, regular rhythm and normal heart sounds.   Pulmonary/Chest:       She is noted to appear to tachypneic  which is gets worse when he starts talking. He is known to have some mild retractions. He is noted to have decreased breath sounds diffusely bilaterally and he has expiratory wheezing. He sometimes has audible wheezing  Neurological: He is alert and oriented to person, place, and time. He has normal reflexes.  Skin: Skin is warm and dry.  Psychiatric: He has a normal mood and affect. His behavior is normal.    ED Course  Procedures (including critical care time)   Patient received a continuous  nebulizer with albuterol 15 mg plus Atrovent 0.5 mg on recheck he has much improved air movement and no wheezing. He was given Solu-Medrol 125 mg IM per patient request. He states he feels ready to go home.  1. Asthma attack     Medications  predniSONE (DELTASONE) 10 MG tablet (not administered)  methylPREDNISolone sodium succinate (SOLU-MEDROL) 125 MG injection 125 mg (125 mg Intramuscular Given 01/09/11 1503)  ipratropium (ATROVENT) nebulizer solution 0.5 mg (0.5 mg Nebulization Given 01/09/11 1538)  sodium chloride 0.9 % nebulizer solution (3 mL  Given 01/09/11 1538)   Devoria Albe, MD, FACEP    MDM          Ward Givens, MD 01/09/11 1800

## 2011-01-16 ENCOUNTER — Other Ambulatory Visit: Payer: Self-pay

## 2011-01-16 ENCOUNTER — Encounter (HOSPITAL_COMMUNITY): Payer: Self-pay | Admitting: *Deleted

## 2011-01-16 ENCOUNTER — Emergency Department (HOSPITAL_COMMUNITY)
Admission: EM | Admit: 2011-01-16 | Discharge: 2011-01-16 | Disposition: A | Discharge status: Home / Self Care | Payer: Federal, State, Local not specified - PPO | Attending: Emergency Medicine | Admitting: Emergency Medicine

## 2011-01-16 ENCOUNTER — Emergency Department (HOSPITAL_COMMUNITY): Payer: Federal, State, Local not specified - PPO

## 2011-01-16 DIAGNOSIS — J45909 Unspecified asthma, uncomplicated: Secondary | ICD-10-CM | POA: Insufficient documentation

## 2011-01-16 DIAGNOSIS — K219 Gastro-esophageal reflux disease without esophagitis: Secondary | ICD-10-CM | POA: Insufficient documentation

## 2011-01-16 DIAGNOSIS — R0682 Tachypnea, not elsewhere classified: Secondary | ICD-10-CM | POA: Insufficient documentation

## 2011-01-16 DIAGNOSIS — F17200 Nicotine dependence, unspecified, uncomplicated: Secondary | ICD-10-CM | POA: Insufficient documentation

## 2011-01-16 DIAGNOSIS — M129 Arthropathy, unspecified: Secondary | ICD-10-CM | POA: Insufficient documentation

## 2011-01-16 DIAGNOSIS — J45901 Unspecified asthma with (acute) exacerbation: Secondary | ICD-10-CM

## 2011-01-16 LAB — D-DIMER, QUANTITATIVE: D-Dimer, Quant: 0.24 ug/mL-FEU (ref 0.00–0.48)

## 2011-01-16 MED ORDER — AZITHROMYCIN 250 MG PO TABS: 250.0000 mg | ORAL_TABLET | Freq: Every day | ORAL | Status: AC

## 2011-01-16 MED ORDER — IPRATROPIUM BROMIDE 0.02 % IN SOLN
0.5000 mg | Freq: Once | RESPIRATORY_TRACT | Status: AC
  Administered 2011-01-16: 0.5 mg via RESPIRATORY_TRACT
  Filled 2011-01-16: qty 2.5

## 2011-01-16 MED ORDER — ALBUTEROL SULFATE (5 MG/ML) 0.5% IN NEBU
INHALATION_SOLUTION | RESPIRATORY_TRACT | Status: AC
  Filled 2011-01-16: qty 0.5

## 2011-01-16 MED ORDER — PREDNISONE 20 MG PO TABS
60.0000 mg | ORAL_TABLET | Freq: Once | ORAL | Status: AC
  Administered 2011-01-16: 60 mg via ORAL
  Filled 2011-01-16: qty 3

## 2011-01-16 MED ORDER — ALBUTEROL SULFATE (5 MG/ML) 0.5% IN NEBU
2.5000 mg | INHALATION_SOLUTION | Freq: Once | RESPIRATORY_TRACT | Status: AC
  Administered 2011-01-16: 2.5 mg via RESPIRATORY_TRACT
  Filled 2011-01-16: qty 0.5

## 2011-01-16 MED ORDER — ALBUTEROL SULFATE (5 MG/ML) 0.5% IN NEBU: 2.5000 mg | INHALATION_SOLUTION | Freq: Once | RESPIRATORY_TRACT | Status: DC

## 2011-01-16 MED ORDER — ALBUTEROL SULFATE (5 MG/ML) 0.5% IN NEBU
2.5000 mg | INHALATION_SOLUTION | Freq: Once | RESPIRATORY_TRACT | Status: AC
  Administered 2011-01-16: 2.5 mg via RESPIRATORY_TRACT

## 2011-01-16 MED ORDER — PREDNISONE 50 MG PO TABS: ORAL_TABLET | ORAL | Status: AC

## 2011-01-16 MED ORDER — ALBUTEROL SULFATE HFA 108 (90 BASE) MCG/ACT IN AERS: 2.0000 | INHALATION_SPRAY | RESPIRATORY_TRACT | Status: DC | PRN

## 2011-01-16 NOTE — ED Notes (Signed)
Pt was seen in the ed this past Sunday. Pt states he felt better at discharge Sunday night but woke Monday morning sob. Pt states he could not go to work on Wednesday. Pt states it is difficult to get full breath.

## 2011-01-16 NOTE — ED Notes (Signed)
Received report pt assessment unchanged LS wheezy though out bilateral lung fields; labored on exertion; No shortness of breath noted at rest

## 2011-01-16 NOTE — ED Provider Notes (Signed)
History    Scribed for Glynn Octave, MD, the patient was seen in room APA01/APA01. This chart was scribed by Katha Cabal.   CSN: [id number] Arrival date & time: 01/16/2011  5:33 PM   None     Chief Complaint  Patient presents with  . Asthma    (Consider location/radiation/quality/duration/timing/severity/associated sxs/prior treatment) HPI JHALIL SILVERA is a 45 y.o. male who presents to the Emergency Department complaining of new asthma episode.  Patient complains of persistent intermittent SOB, cough, sneezing, chest pain and abdominal bloating for past week.  Patient was recently evaluated in ED a week ago for similar symptoms. Patient reports recent sick contacts at home with cough and sneezing.  Patient took steroids and nebulizer treatment with mild relief. Patient took hydrocodone for pain.   Symptoms are gradually worsening.  Patient was admitted for similar asthma episodes in the past.  Patient with history of GERD and diverticulitis.     Past Medical History  Diagnosis Date  . Asthma   . Diverticulitis   . GERD (gastroesophageal reflux disease)   . Arthritis     Past Surgical History  Procedure Date  . Tonsillectomy   . Sigmoid resection / rectopexy     No family history on file.  History  Substance Use Topics  . Smoking status: Current Some Day Smoker    Types: Cigarettes  . Smokeless tobacco: Not on file  . Alcohol Use: No      Review of Systems  All other systems reviewed and are negative.    Allergies  Dm  Home Medications   Current Outpatient Rx  Name Route Sig Dispense Refill  . ALBUTEROL SULFATE HFA 108 (90 BASE) MCG/ACT IN AERS Inhalation Inhale 1-2 puffs into the lungs every 6 (six) hours as needed for wheezing. 1 Inhaler 0  . ALBUTEROL SULFATE (2.5 MG/3ML) 0.083% IN NEBU Nebulization Take 2.5 mg by nebulization every 6 (six) hours as needed. For shortness of breath    . BENZONATATE 100 MG PO CAPS Oral Take 100 mg by mouth 2 (two)  times daily as needed.      . BUDESONIDE-FORMOTEROL FUMARATE 160-4.5 MCG/ACT IN AERO Inhalation Inhale 2 puffs into the lungs 2 (two) times daily. Pt currently out of medication     . DIPHENHYDRAMINE HCL 25 MG PO CAPS Oral Take 25 mg by mouth every 6 (six) hours as needed. For allergies     . PRIMATENE ASTHMA PO Oral Take 2 tablets by mouth every 8 (eight) hours as needed. For shortness of breath     . GUAIFENESIN 100 MG/5ML PO SOLN Oral Take 5 mLs by mouth every 4 (four) hours as needed. For cough     . IBUPROFEN 200 MG PO TABS Oral Take 400 mg by mouth every 6 (six) hours as needed. For pain     . PREDNISONE 10 MG PO TABS Oral Take 2 tablets (20 mg total) by mouth daily. 10 tablet 0  . PREDNISONE 10 MG PO TABS  Take 3 po QD x 2d starting tomorrow, then 2 po QD x 3d then 1 po QD x 3d  15 tablet 0    BP 121/78  Pulse 105  Temp(Src) 97.5 F (36.4 C) (Oral)  Resp 19  Ht 5\' 9"  (1.753 m)  Wt 194 lb (87.998 kg)  BMI 28.65 kg/m2  SpO2 98%  Physical Exam  Constitutional: He is oriented to person, place, and time. He appears well-developed and well-nourished. No distress.  HENT:  Head:  Normocephalic and atraumatic.  Mouth/Throat: Oropharynx is clear and moist.  Eyes: EOM are normal. Pupils are equal, round, and reactive to light.  Neck: Neck supple.  Cardiovascular: Normal rate and regular rhythm.   Pulmonary/Chest: Tachypnea noted. No respiratory distress. He has wheezes.       Diffuse wheezing, good air exchange, tachypnea that gets worse with talking   Abdominal: Soft. There is no tenderness.  Musculoskeletal: Normal range of motion.  Neurological: He is alert and oriented to person, place, and time.  Skin: Skin is warm and dry.  Psychiatric: He has a normal mood and affect. His behavior is normal.    ED Course  Procedures (including critical care time)   DIAGNOSTIC STUDIES: Oxygen Saturation is 97% on room air, normal by my interpretation.  Will order CRX and breathing  treatment.   COORDINATION OF CARE:   Orders Placed This Encounter  Procedures  . DG Chest 2 View  . D-dimer, quantitative  . Check Pulse Oximetry while ambulating  . ED EKG     LABS / RADIOLOGY:    Labs Reviewed  D-DIMER, QUANTITATIVE   Dg Chest 2 View  01/16/2011  *RADIOLOGY REPORT*  Clinical Data: Short of breath  CHEST - 2 VIEW  Comparison: 12/02/2009  Findings: Stable bronchitic changes.  Normal heart size.  No peripheral consolidation.  No pneumothorax or pleural effusion. Mild hyperaeration.  IMPRESSION: Bronchitic changes and hyperaeration without consolidation.  Original Report Authenticated By: Donavan Burnet, M.D.         MDM   MDM: asthma exacerbation, recent tapering of steroids with SOB, wheezing, cough.  No hypoxia, mild tachypnea.  Improvement in wheezing air exchange after given them. Patient speaking full-size is 97% room air. I will dose additional albuterol and trial ambulation. No focal infiltrate on chest x-ray.  Ambulated in ED without desaturation.  Lungs CTA.     MEDICATIONS GIVEN IN THE E.D. Scheduled Meds:    . albuterol  2.5 mg Nebulization Once  . albuterol  2.5 mg Nebulization Once  . albuterol  2.5 mg Nebulization Once  . albuterol      . ipratropium  0.5 mg Nebulization Once  . predniSONE  60 mg Oral Once   Continuous Infusions:      IMPRESSION: No diagnosis found.   I personally performed the services described in this documentation, which was scribed in my presence.  The recorded information has been reviewed and considered.           Glynn Octave, MD 01/16/11 2029

## 2011-01-16 NOTE — ED Notes (Signed)
Pt stable at discharge and ambulatory without complaints

## 2011-01-16 NOTE — ED Notes (Signed)
Pt ambulated in hall without 02 sats 96% wheezing noted with shortness of breath however pt tolerated ambulation without problems

## 2011-01-29 ENCOUNTER — Emergency Department (HOSPITAL_COMMUNITY)
Admission: EM | Admit: 2011-01-29 | Discharge: 2011-01-29 | Disposition: A | Discharge status: Home / Self Care | Payer: Federal, State, Local not specified - PPO | Attending: Emergency Medicine | Admitting: Emergency Medicine

## 2011-01-29 ENCOUNTER — Encounter (HOSPITAL_COMMUNITY): Payer: Self-pay

## 2011-01-29 DIAGNOSIS — J45909 Unspecified asthma, uncomplicated: Secondary | ICD-10-CM

## 2011-01-29 MED ORDER — PREDNISONE 10 MG PO TABS: ORAL_TABLET | ORAL | Status: DC

## 2011-01-29 MED ORDER — PREDNISONE 20 MG PO TABS
60.0000 mg | ORAL_TABLET | Freq: Once | ORAL | Status: AC
  Administered 2011-01-29: 60 mg via ORAL
  Filled 2011-01-29: qty 3

## 2011-01-29 MED ORDER — ALBUTEROL (5 MG/ML) CONTINUOUS INHALATION SOLN
5.0000 mg/h | INHALATION_SOLUTION | Freq: Once | RESPIRATORY_TRACT | Status: DC
  Filled 2011-01-29: qty 20

## 2011-01-29 MED ORDER — ALBUTEROL (5 MG/ML) CONTINUOUS INHALATION SOLN
10.0000 mg | INHALATION_SOLUTION | RESPIRATORY_TRACT | Status: AC
  Administered 2011-01-29: 10 mg via RESPIRATORY_TRACT

## 2011-01-29 MED ORDER — HYDROCOD POLST-CHLORPHEN POLST 10-8 MG/5ML PO LQCR
5.0000 mL | Freq: Once | ORAL | Status: AC
  Administered 2011-01-29: 5 mL via ORAL
  Filled 2011-01-29: qty 5

## 2011-01-29 MED ORDER — ALBUTEROL SULFATE (5 MG/ML) 0.5% IN NEBU
INHALATION_SOLUTION | RESPIRATORY_TRACT | Status: AC
  Administered 2011-01-29: 5 mg
  Filled 2011-01-29: qty 1

## 2011-01-29 MED ORDER — IPRATROPIUM BROMIDE 0.02 % IN SOLN
0.5000 mg | Freq: Once | RESPIRATORY_TRACT | Status: AC
  Administered 2011-01-29: 0.5 mg via RESPIRATORY_TRACT
  Filled 2011-01-29: qty 2.5

## 2011-01-29 MED ORDER — PROMETHAZINE-CODEINE 6.25-10 MG/5ML PO SYRP: 5.0000 mL | ORAL_SOLUTION | ORAL | Status: AC | PRN

## 2011-01-29 MED ORDER — PROMETHAZINE-CODEINE 6.25-10 MG/5ML PO SYRP: 5.0000 mL | ORAL_SOLUTION | ORAL | Status: DC | PRN

## 2011-01-29 MED ORDER — ALBUTEROL SULFATE (5 MG/ML) 0.5% IN NEBU
5.0000 mg | INHALATION_SOLUTION | Freq: Once | RESPIRATORY_TRACT | Status: AC
  Administered 2011-01-29: 2.5 mg via RESPIRATORY_TRACT
  Filled 2011-01-29: qty 1

## 2011-01-29 NOTE — ED Notes (Signed)
Pt reports has been trying to deal with asthma exacerbation at home since Friday but is not getting any better.  Has been using albuterol inhaler and nebulizer.  Last neb treatment was ago.  Has had 2 breathing treatments at home today.

## 2011-02-05 ENCOUNTER — Emergency Department (HOSPITAL_COMMUNITY): Payer: Federal, State, Local not specified - PPO

## 2011-02-05 ENCOUNTER — Encounter (HOSPITAL_COMMUNITY): Payer: Self-pay | Admitting: *Deleted

## 2011-02-05 ENCOUNTER — Emergency Department (HOSPITAL_COMMUNITY)
Admission: EM | Admit: 2011-02-05 | Discharge: 2011-02-05 | Disposition: A | Discharge status: Home / Self Care | Payer: Federal, State, Local not specified - PPO | Attending: Emergency Medicine | Admitting: Emergency Medicine

## 2011-02-05 DIAGNOSIS — R05 Cough: Secondary | ICD-10-CM | POA: Insufficient documentation

## 2011-02-05 DIAGNOSIS — R059 Cough, unspecified: Secondary | ICD-10-CM | POA: Insufficient documentation

## 2011-02-05 DIAGNOSIS — J45901 Unspecified asthma with (acute) exacerbation: Secondary | ICD-10-CM | POA: Insufficient documentation

## 2011-02-05 DIAGNOSIS — R0602 Shortness of breath: Secondary | ICD-10-CM | POA: Insufficient documentation

## 2011-02-05 MED ORDER — IPRATROPIUM BROMIDE 0.02 % IN SOLN
0.5000 mg | Freq: Once | RESPIRATORY_TRACT | Status: AC
  Administered 2011-02-05: 0.5 mg via RESPIRATORY_TRACT
  Filled 2011-02-05: qty 2.5

## 2011-02-05 MED ORDER — ALBUTEROL SULFATE (5 MG/ML) 0.5% IN NEBU
2.5000 mg | INHALATION_SOLUTION | Freq: Once | RESPIRATORY_TRACT | Status: AC
  Administered 2011-02-05: 2.5 mg via RESPIRATORY_TRACT
  Filled 2011-02-05: qty 0.5

## 2011-02-05 MED ORDER — ALBUTEROL SULFATE (5 MG/ML) 0.5% IN NEBU
2.5000 mg | INHALATION_SOLUTION | Freq: Once | RESPIRATORY_TRACT | Status: AC
  Administered 2011-02-05: 2.5 mg via RESPIRATORY_TRACT
  Filled 2011-02-05: qty 1

## 2011-02-05 MED ORDER — ALBUTEROL SULFATE (5 MG/ML) 0.5% IN NEBU
2.5000 mg | INHALATION_SOLUTION | Freq: Once | RESPIRATORY_TRACT | Status: AC
  Administered 2011-02-05: 2.5 mg via RESPIRATORY_TRACT

## 2011-02-05 MED ORDER — DEXAMETHASONE SODIUM PHOSPHATE 4 MG/ML IJ SOLN
10.0000 mg | Freq: Once | INTRAMUSCULAR | Status: AC
  Administered 2011-02-05: 10 mg via INTRAMUSCULAR
  Filled 2011-02-05: qty 3

## 2011-02-05 MED ORDER — SODIUM CHLORIDE 0.9 % IN NEBU
INHALATION_SOLUTION | RESPIRATORY_TRACT | Status: AC
  Administered 2011-02-05: 3 mL
  Filled 2011-02-05: qty 3

## 2011-02-05 MED ORDER — BUDESONIDE-FORMOTEROL FUMARATE 160-4.5 MCG/ACT IN AERO
2.0000 | INHALATION_SPRAY | Freq: Two times a day (BID) | RESPIRATORY_TRACT | Status: DC
  Filled 2011-02-05: qty 6

## 2011-02-05 MED ORDER — PREDNISONE 20 MG PO TABS
40.0000 mg | ORAL_TABLET | Freq: Once | ORAL | Status: AC
  Administered 2011-02-05: 40 mg via ORAL
  Filled 2011-02-05: qty 2

## 2011-02-05 NOTE — ED Notes (Signed)
Pt c/o difficulty breathing and sob. Pt states sx started last pm and has gotten worse.

## 2011-02-05 NOTE — ED Notes (Signed)
Pt states he can breath better after second breathing tx. Still has upper airway congestion

## 2011-02-05 NOTE — ED Provider Notes (Signed)
History  Scribed for Rolan Bucco, MD, the patient was seen in room APA17. This chart was scribed by Hillery Hunter. The patient's care started at 09:46   CSN: [id number] Arrival date & time: 02/05/2011  9:37 AM   First MD Initiated Contact with Patient 02/05/11 [phone]      Chief Complaint  Patient presents with  . Asthma    The history is provided by the patient.   Robert Gordon is a 45 y.o. male with a history of asthma who presents to the Emergency Department today complaining of shortness of breath intermittently over the last month that worsened last night.  Patient states these sx are similar to those which he associates with his history of asthma. He reports that he stopped taking Symbicort after running out two weeks ago. He complains of associated dry cough, congestion, and a subjective fever since last night.    HPI ELEMENTS:  Location: respiratory Duration: since last night  Timing: constant  Quality: exactly similar to symptoms associated with previous diagnosis of asthma Context: stopped using Symbicort two weeks ago after running out. States that he has been heating his house with his oven in the last few days and feels that this may be drying out the air in his house, potentially triggering asthma exacerbation Associated symptoms: dry cough, subjective warmth     Past Medical History  Diagnosis Date  . Asthma   . Diverticulitis   . GERD (gastroesophageal reflux disease)   . Arthritis     Past Surgical History  Procedure Date  . Tonsillectomy   . Sigmoid resection / rectopexy     History reviewed. No pertinent family history.  History  Substance Use Topics  . Smoking status: Current Some Day Smoker    Types: Cigarettes  . Smokeless tobacco: Not on file  . Alcohol Use: No      Review of Systems  Constitutional: Positive for fever (subjective warmth).  HENT: Positive for congestion.   Respiratory: Positive for cough (dry), chest  tightness and wheezing.   Cardiovascular: Negative for chest pain.  Gastrointestinal: Negative for nausea, vomiting, abdominal pain and diarrhea.  Skin: Positive for rash (h/o eczema, unchanged).  All other systems reviewed and are negative.    Allergies  Dm  Home Medications   Current Outpatient Rx  Name Route Sig Dispense Refill  . ALBUTEROL SULFATE HFA 108 (90 BASE) MCG/ACT IN AERS Inhalation Inhale 2 puffs into the lungs every 4 (four) hours as needed for wheezing. 1 Inhaler 0  . ALBUTEROL SULFATE (2.5 MG/3ML) 0.083% IN NEBU Nebulization Take 2.5 mg by nebulization every 6 (six) hours as needed. For shortness of breath    . BENZONATATE 100 MG PO CAPS Oral Take 100 mg by mouth 2 (two) times daily as needed. cough    . BUDESONIDE-FORMOTEROL FUMARATE 160-4.5 MCG/ACT IN AERO Inhalation Inhale 2 puffs into the lungs 2 (two) times daily. Pt currently out of medication     . DIPHENHYDRAMINE HCL 25 MG PO CAPS Oral Take 25 mg by mouth every 6 (six) hours as needed. For allergies     . PRIMATENE ASTHMA PO Oral Take 2 tablets by mouth every 8 (eight) hours as needed. For shortness of breath     . GUAIFENESIN 100 MG/5ML PO SOLN Oral Take 5 mLs by mouth every 4 (four) hours as needed. For cough     . IBUPROFEN 200 MG PO TABS Oral Take 400 mg by mouth every 6 (six) hours as needed.  For pain     . PREDNISONE 10 MG PO TABS  6,5,4,3,2,1 - TAKE WITH FOOD 21 tablet 0  . PROMETHAZINE-CODEINE 6.25-10 MG/5ML PO SYRP Oral Take 5 mLs by mouth every 4 (four) hours as needed for cough. 120 mL 0    BP 132/83  Pulse 142  Temp(Src) 99 F (37.2 C) (Oral)  Resp 30  Ht 5\' 9"  (1.753 m)  Wt 195 lb (88.451 kg)  BMI 28.80 kg/m2  SpO2 100%  Physical Exam  Nursing note and vitals reviewed. Constitutional: He is oriented to person, place, and time. He appears well-developed and well-nourished.  HENT:  Head: Normocephalic and atraumatic.  Right Ear: External ear normal.  Left Ear: External ear normal.    Mouth/Throat: Oropharynx is clear and moist. No oropharyngeal exudate.  Eyes: Pupils are equal, round, and reactive to light.  Neck: Normal range of motion. Neck supple.  Cardiovascular: Normal rate, regular rhythm and normal heart sounds.   Pulmonary/Chest: Effort normal. No respiratory distress. He has wheezes (throughout). He has no rales. He exhibits no tenderness.       Decreased breath sounds bilaterally Speaking in full sentences  Abdominal: Soft. Bowel sounds are normal. There is no tenderness. There is no rebound and no guarding.  Musculoskeletal: Normal range of motion. He exhibits no edema.  Lymphadenopathy:    He has no cervical adenopathy.  Neurological: He is alert and oriented to person, place, and time.  Skin: Skin is warm and dry. No rash noted.  Psychiatric: He has a normal mood and affect.    ED Course  Procedures   Dg Chest 2 View  02/05/2011  *RADIOLOGY REPORT*  Clinical Data: Wheezing.  Asthma  CHEST - 2 VIEW  Comparison: 01/16/2011  Findings: Heart size appears normal.  No pleural effusion or pulmonary edema.  No airspace consolidation identified.  IMPRESSION:  1.  No active cardiopulmonary abnormalities.  Original Report Authenticated By: Rosealee Albee, M.D.     OTHER DATA REVIEWED: Nursing notes, vital signs reviewed.  DIAGNOSTIC STUDIES: Oxygen Saturation is 100% on room air, normal by my interpretation.     ED COURSE / COORDINATION OF CARE:  9:53 Ordered: albuterol (PROVENTIL) (5 MG/ML) 0.5% nebulizer solution 2.5 mg ; ipratropium (ATROVENT) nebulizer solution 0.5 mg dexamethasone (DECADRON) injection 10 mg, DG CHEST 2 VIEW  12:21 PM Recheck wheezes improved but still present 14:25, feeling better, ready to go home  MDM  Pt with asthma exacerbation, typical for his past exacerbations.  Will check cxr to r/o pneumonia.  Has been out of his symbicort for 2 weeks, will represcribe this.  Is using electric stove to heat room, drier air may be  contributing to his increased flare-ups, discussed using humidifier in room  1. Asthma      I personally performed the services described in this documentation, which was scribed in my presence.  The recorded information has been reviewed and considered.       Rolan Bucco, MD 02/05/11 1524

## 2012-09-05 ENCOUNTER — Emergency Department (HOSPITAL_COMMUNITY)
Admission: EM | Admit: 2012-09-05 | Discharge: 2012-09-06 | Disposition: A | Discharge status: Home / Self Care | Payer: Federal, State, Local not specified - PPO | Attending: Emergency Medicine | Admitting: Emergency Medicine

## 2012-09-05 ENCOUNTER — Emergency Department (HOSPITAL_COMMUNITY): Payer: Federal, State, Local not specified - PPO

## 2012-09-05 ENCOUNTER — Encounter (HOSPITAL_COMMUNITY): Payer: Self-pay | Admitting: Emergency Medicine

## 2012-09-05 DIAGNOSIS — Z8739 Personal history of other diseases of the musculoskeletal system and connective tissue: Secondary | ICD-10-CM | POA: Insufficient documentation

## 2012-09-05 DIAGNOSIS — M129 Arthropathy, unspecified: Secondary | ICD-10-CM | POA: Insufficient documentation

## 2012-09-05 DIAGNOSIS — Z79899 Other long term (current) drug therapy: Secondary | ICD-10-CM | POA: Insufficient documentation

## 2012-09-05 DIAGNOSIS — R109 Unspecified abdominal pain: Secondary | ICD-10-CM

## 2012-09-05 DIAGNOSIS — K219 Gastro-esophageal reflux disease without esophagitis: Secondary | ICD-10-CM | POA: Insufficient documentation

## 2012-09-05 DIAGNOSIS — Z8719 Personal history of other diseases of the digestive system: Secondary | ICD-10-CM | POA: Insufficient documentation

## 2012-09-05 DIAGNOSIS — J45909 Unspecified asthma, uncomplicated: Secondary | ICD-10-CM | POA: Insufficient documentation

## 2012-09-05 DIAGNOSIS — F17200 Nicotine dependence, unspecified, uncomplicated: Secondary | ICD-10-CM | POA: Insufficient documentation

## 2012-09-05 DIAGNOSIS — R1032 Left lower quadrant pain: Secondary | ICD-10-CM | POA: Insufficient documentation

## 2012-09-05 HISTORY — DX: Idiopathic aseptic necrosis of left femur: M87.051

## 2012-09-05 HISTORY — DX: Idiopathic aseptic necrosis of left femur: M87.052

## 2012-09-05 LAB — CBC WITH DIFFERENTIAL/PLATELET
Eosinophils Relative: 6 % — ABNORMAL HIGH (ref 0–5)
HCT: 43.4 % (ref 39.0–52.0)
Hemoglobin: 14.9 g/dL (ref 13.0–17.0)
Lymphocytes Relative: 39 % (ref 12–46)
Lymphs Abs: 3.5 10*3/uL (ref 0.7–4.0)
MCV: 90.8 fL (ref 78.0–100.0)
Monocytes Absolute: 0.6 10*3/uL (ref 0.1–1.0)
RBC: 4.78 MIL/uL (ref 4.22–5.81)
WBC: 9.1 10*3/uL (ref 4.0–10.5)

## 2012-09-05 LAB — COMPREHENSIVE METABOLIC PANEL
ALT: 20 U/L (ref 0–53)
CO2: 26 mEq/L (ref 19–32)
Calcium: 9.1 mg/dL (ref 8.4–10.5)
Creatinine, Ser: 1.05 mg/dL (ref 0.50–1.35)
GFR calc Af Amer: >90 mL/min (ref >90)
GFR calc non Af Amer: 84 mL/min — ABNORMAL LOW (ref >90)
Glucose, Bld: 94 mg/dL (ref 70–99)
Sodium: 138 mEq/L (ref 135–145)

## 2012-09-05 MED ORDER — IOHEXOL 300 MG/ML  SOLN
50.0000 mL | Freq: Once | INTRAMUSCULAR | Status: AC | PRN
  Administered 2012-09-05: 50 mL via ORAL

## 2012-09-05 MED ORDER — ONDANSETRON HCL 4 MG/2ML IJ SOLN
4.0000 mg | INTRAMUSCULAR | Status: DC | PRN
  Administered 2012-09-05: 4 mg via INTRAVENOUS
  Filled 2012-09-05: qty 2

## 2012-09-05 MED ORDER — IOHEXOL 300 MG/ML  SOLN
100.0000 mL | Freq: Once | INTRAMUSCULAR | Status: AC | PRN
  Administered 2012-09-05: 100 mL via INTRAVENOUS

## 2012-09-05 MED ORDER — SODIUM CHLORIDE 0.9 % IV SOLN
INTRAVENOUS | Status: DC
  Administered 2012-09-05: 22:00:00 via INTRAVENOUS

## 2012-09-05 MED ORDER — MORPHINE SULFATE 4 MG/ML IJ SOLN
4.0000 mg | INTRAMUSCULAR | Status: DC | PRN
  Administered 2012-09-05: 4 mg via INTRAVENOUS
  Filled 2012-09-05: qty 1

## 2012-09-05 NOTE — ED Notes (Signed)
Drinking oral contrasr

## 2012-09-05 NOTE — ED Provider Notes (Signed)
History    CSN: [id number] Arrival date & time 09/05/12  2104  First MD Initiated Contact with Patient 09/05/12 2126     Chief Complaint  Patient presents with  . Abdominal Pain    HPI Pt was seen at 2120.   Per pt, c/o gradual onset and persistence of constant left lower abd "pain" since yesterday.  Has been associated with nausea and "feeling constipated."  Describes the abd pain as "burning."  Denies vomiting/diarrhea, no fevers, no back pain, no rash, no CP/SOB, no black or blood in stools, no testicular pain/swelling, no dysuria/hematuria.       Past Medical History  Diagnosis Date  . Asthma   . Diverticulitis   . GERD (gastroesophageal reflux disease)   . Arthritis   . Avascular necrosis of bones of both hips    Past Surgical History  Procedure Laterality Date  . Tonsillectomy    . Sigmoid resection / rectopexy      History  Substance Use Topics  . Smoking status: Current Some Day Smoker    Types: Cigarettes  . Smokeless tobacco: Not on file  . Alcohol Use: No    Review of Systems ROS: Statement: All systems negative except as marked or noted in the HPI; Constitutional: Negative for fever and chills. ; ; Eyes: Negative for eye pain, redness and discharge. ; ; ENMT: Negative for ear pain, hoarseness, nasal congestion, sinus pressure and sore throat. ; ; Cardiovascular: Negative for chest pain, palpitations, diaphoresis, dyspnea and peripheral edema. ; ; Respiratory: Negative for cough, wheezing and stridor. ; ; Gastrointestinal: +abd pain, constipation, nausea. Negative for vomiting, diarrhea, blood in stool, hematemesis, jaundice and rectal bleeding. . ; ; Genitourinary: Negative for dysuria, flank pain and hematuria. ; ; Genital:  No penile drainage or rash, no testicular pain or swelling, no scrotal rash or swelling.;; Musculoskeletal: Negative for back pain and neck pain. Negative for swelling and trauma.; ; Skin: Negative for pruritus, rash, abrasions, blisters,  bruising and skin lesion.; ; Neuro: Negative for headache, lightheadedness and neck stiffness. Negative for weakness, altered level of consciousness , altered mental status, extremity weakness, paresthesias, involuntary movement, seizure and syncope.       Allergies  Dextromethorphan; Dm; and Lactaid  Home Medications   Current Outpatient Rx  Name  Route  Sig  Dispense  Refill  . acetaminophen (TYLENOL) 500 MG tablet   Oral   Take 1,000 mg by mouth every 6 (six) hours as needed. For pain           . albuterol (PROVENTIL HFA;VENTOLIN HFA) 108 (90 BASE) MCG/ACT inhaler   Inhalation   Inhale 2 puffs into the lungs every 4 (four) hours as needed for wheezing.   1 Inhaler   0   . atorvastatin (LIPITOR) 40 MG tablet   Oral   Take 20-40 mg by mouth at bedtime.         . benzonatate (TESSALON) 100 MG capsule   Oral   Take 100 mg by mouth 2 (two) times daily as needed. For cough         . budesonide-formoterol (SYMBICORT) 160-4.5 MCG/ACT inhaler   Inhalation   Inhale 2 puffs into the lungs 2 (two) times daily. Pt currently out of medication but normally takes the medication everyday         . docusate sodium (COLACE) 100 MG capsule   Oral   Take 100 mg by mouth daily.         Marland Kitchen  ibuprofen (ADVIL,MOTRIN) 200 MG tablet   Oral   Take 400 mg by mouth every 6 (six) hours as needed. For pain          . Ibuprofen (MIDOL) 200 MG CAPS   Oral   Take 1 capsule by mouth once as needed (for bloating and/or pain).         . Simethicone (PHAZYME) 180 MG CAPS   Oral   Take 1 capsule by mouth daily as needed (for bloating and/or pain).         . traMADol (ULTRAM) 50 MG tablet   Oral   Take 25-50 mg by mouth daily as needed for pain.         Marland Kitchen albuterol (PROVENTIL) (2.5 MG/3ML) 0.083% nebulizer solution   Nebulization   Take 2.5 mg by nebulization every 4 (four) hours as needed. For shortness of breath         . ipratropium (ATROVENT) 0.02 % nebulizer solution    Nebulization   Take 500 mcg by nebulization every 4 (four) hours as needed. For shortness of breath            BP 118/76  Pulse 96  Temp(Src) 97.6 F (36.4 C) (Oral)  Resp 20  Ht 5\' 9"  (1.753 m)  Wt 197 lb (89.359 kg)  BMI 29.08 kg/m2  SpO2 100%  Physical Exam 2125: Physical examination:  Nursing notes reviewed; Vital signs and O2 SAT reviewed;  Constitutional: Well developed, Well nourished, Well hydrated, In no acute distress; Head:  Normocephalic, atraumatic; Eyes: EOMI, PERRL, No scleral icterus; ENMT: Mouth and pharynx normal, Mucous membranes moist; Neck: Supple, Full range of motion, No lymphadenopathy; Cardiovascular: Regular rate and rhythm, No gallop; Respiratory: Breath sounds clear & equal bilaterally, No rales, rhonchi, wheezes.  Speaking full sentences with ease, Normal respiratory effort/excursion; Chest: Nontender, Movement normal; Abdomen: Soft, +mild LLQ tenderness to palp. No rebound or guarding. Nondistended, Normal bowel sounds; Genitourinary: No CVA tenderness; Extremities: Pulses normal, No tenderness, No edema, No calf edema or asymmetry.; Neuro: AA&Ox3, Major CN grossly intact.  Speech clear. No gross focal motor or sensory deficits in extremities.; Skin: Color normal, Warm, Dry.   ED Course  Procedures     MDM  MDM Reviewed: previous chart, nursing note and vitals Interpretation: labs   Results for orders placed during the hospital encounter of 09/05/12  CBC WITH DIFFERENTIAL      Result Value Range   WBC 9.1  4.0 - 10.5 K/uL   RBC 4.78  4.22 - 5.81 MIL/uL   Hemoglobin 14.9  13.0 - 17.0 g/dL   HCT 16.1  09.6 - 04.5 %   MCV 90.8  78.0 - 100.0 fL   MCH 31.2  26.0 - 34.0 pg   MCHC 34.3  30.0 - 36.0 g/dL   RDW 40.9  81.1 - 91.4 %   Platelets 224  150 - 400 K/uL   Neutrophils Relative % 47  43 - 77 %   Neutro Abs 4.3  1.7 - 7.7 K/uL   Lymphocytes Relative 39  12 - 46 %   Lymphs Abs 3.5  0.7 - 4.0 K/uL   Monocytes Relative 7  3 - 12 %   Monocytes  Absolute 0.6  0.1 - 1.0 K/uL   Eosinophils Relative 6 (*) 0 - 5 %   Eosinophils Absolute 0.6  0.0 - 0.7 K/uL   Basophils Relative 1  0 - 1 %   Basophils Absolute 0.1  0.0 - 0.1 K/uL  COMPREHENSIVE METABOLIC PANEL      Result Value Range   Sodium 138  135 - 145 mEq/L   Potassium 3.6  3.5 - 5.1 mEq/L   Chloride 102  96 - 112 mEq/L   CO2 26  19 - 32 mEq/L   Glucose, Bld 94  70 - 99 mg/dL   BUN 13  6 - 23 mg/dL   Creatinine, Ser 8.41  0.50 - 1.35 mg/dL   Calcium 9.1  8.4 - 32.4 mg/dL   Total Protein 6.8  6.0 - 8.3 g/dL   Albumin 3.8  3.5 - 5.2 g/dL   AST 15  0 - 37 U/L   ALT 20  0 - 53 U/L   Alkaline Phosphatase 66  39 - 117 U/L   Total Bilirubin 0.2 (*) 0.3 - 1.2 mg/dL   GFR calc non Af Amer 84 (*) >90 mL/min   GFR calc Af Amer >90  >90 mL/min  LIPASE, BLOOD      Result Value Range   Lipase 27  11 - 59 U/L     2300:  XR, CT and UA pending. Sign out to Dr. Preston Fleeting.       Laray Anger, DO 09/05/12 2305

## 2012-09-05 NOTE — ED Notes (Signed)
Patient c/o left side pain and lower left abdominal pain since yesterday.  Patient states having nausea, but denies vomiting or diarrhea.

## 2012-09-06 ENCOUNTER — Telehealth (HOSPITAL_COMMUNITY): Payer: Self-pay | Admitting: Emergency Medicine

## 2012-09-06 MED ORDER — OXYCODONE-ACETAMINOPHEN 5-325 MG PO TABS: 1.0000 | ORAL_TABLET | ORAL | Status: DC | PRN

## 2012-09-06 NOTE — ED Provider Notes (Signed)
Patient initially seen and evaluated by Dr. Clarene Duke. Laboratory workup has been unremarkable and CT of the abdomen and pelvis is unremarkable. There had been concern for diverticulitis and there is no evidence of diverticulitis in CT scan. He'll be treated symptomatically and is discharged with prescription for oxycodone-acetaminophen. He states he's been having episodes of pain like this for 6 months. He is referred to gastroenterology for further outpatient workup.  Results for orders placed during the hospital encounter of 09/05/12  CBC WITH DIFFERENTIAL      Result Value Range   WBC 9.1  4.0 - 10.5 K/uL   RBC 4.78  4.22 - 5.81 MIL/uL   Hemoglobin 14.9  13.0 - 17.0 g/dL   HCT 81.1  91.4 - 78.2 %   MCV 90.8  78.0 - 100.0 fL   MCH 31.2  26.0 - 34.0 pg   MCHC 34.3  30.0 - 36.0 g/dL   RDW 95.6  21.3 - 08.6 %   Platelets 224  150 - 400 K/uL   Neutrophils Relative % 47  43 - 77 %   Neutro Abs 4.3  1.7 - 7.7 K/uL   Lymphocytes Relative 39  12 - 46 %   Lymphs Abs 3.5  0.7 - 4.0 K/uL   Monocytes Relative 7  3 - 12 %   Monocytes Absolute 0.6  0.1 - 1.0 K/uL   Eosinophils Relative 6 (*) 0 - 5 %   Eosinophils Absolute 0.6  0.0 - 0.7 K/uL   Basophils Relative 1  0 - 1 %   Basophils Absolute 0.1  0.0 - 0.1 K/uL  COMPREHENSIVE METABOLIC PANEL      Result Value Range   Sodium 138  135 - 145 mEq/L   Potassium 3.6  3.5 - 5.1 mEq/L   Chloride 102  96 - 112 mEq/L   CO2 26  19 - 32 mEq/L   Glucose, Bld 94  70 - 99 mg/dL   BUN 13  6 - 23 mg/dL   Creatinine, Ser 5.78  0.50 - 1.35 mg/dL   Calcium 9.1  8.4 - 46.9 mg/dL   Total Protein 6.8  6.0 - 8.3 g/dL   Albumin 3.8  3.5 - 5.2 g/dL   AST 15  0 - 37 U/L   ALT 20  0 - 53 U/L   Alkaline Phosphatase 66  39 - 117 U/L   Total Bilirubin 0.2 (*) 0.3 - 1.2 mg/dL   GFR calc non Af Amer 84 (*) >90 mL/min   GFR calc Af Amer >90  >90 mL/min  LIPASE, BLOOD      Result Value Range   Lipase 27  11 - 59 U/L   Dg Chest 2 View  09/05/2012   *RADIOLOGY  REPORT*  Clinical Data: Pain.  CHEST - 2 VIEW  Comparison: February 05, 2011.  Findings: Cardiomediastinal silhouette appears normal.  No acute pulmonary disease is noted.  Bony thorax is intact.  IMPRESSION: No acute cardiopulmonary abnormality seen.   Original Report Authenticated By: Lupita Raider.,  M.D.   Ct Abdomen Pelvis W Contrast  09/05/2012   *RADIOLOGY REPORT*  Clinical Data: Abdominal pain.  History of diverticulitis.  Prior resection of the sigmoid colon.  CT ABDOMEN AND PELVIS WITH CONTRAST  Technique:  Multidetector CT imaging of the abdomen and pelvis was performed following the standard protocol during bolus administration of intravenous contrast.  Contrast: 50mL OMNIPAQUE IOHEXOL 300 MG/ML  SOLN, OMNIPAQUE IOHEXOL 300 MG/ML  SOLN  Comparison:  CT 06/11/2010  Findings: Lung bases are clear.  No effusions.  Heart is normal size.  Tiny hypodensities scattered throughout the liver, likely small cysts.  These are stable.  Gallbladder, spleen, pancreas, adrenals and kidneys are normal.  Appendix is visualized and is normal.  Postoperative changes in the sigmoid colon.  Small bowel is decompressed.  No free fluid, free air or adenopathy.  Urinary bladder is unremarkable.  No acute bony abnormality.  IMPRESSION: Normal appendix.  No acute findings in the abdomen or pelvis.   Original Report Authenticated By: Charlett Nose, M.D.    Images viewed by me.   Dione Booze, MD 09/06/12 [phone]

## 2012-09-10 MED FILL — Oxycodone w/ Acetaminophen Tab 5-325 MG: ORAL | Qty: 6 | Status: AC

## 2012-09-21 ENCOUNTER — Encounter: Payer: Self-pay | Admitting: Gastroenterology

## 2012-09-25 ENCOUNTER — Encounter: Payer: Self-pay | Admitting: Gastroenterology

## 2012-09-25 ENCOUNTER — Ambulatory Visit (INDEPENDENT_AMBULATORY_CARE_PROVIDER_SITE_OTHER): Payer: Federal, State, Local not specified - PPO | Admitting: Gastroenterology

## 2012-09-25 VITALS — BP 115/72 | HR 77 | Temp 97.4°F | Ht 69.0 in | Wt 192.8 lb

## 2012-09-25 DIAGNOSIS — K5900 Constipation, unspecified: Secondary | ICD-10-CM | POA: Insufficient documentation

## 2012-09-25 DIAGNOSIS — R1032 Left lower quadrant pain: Secondary | ICD-10-CM | POA: Insufficient documentation

## 2012-09-25 DIAGNOSIS — K219 Gastro-esophageal reflux disease without esophagitis: Secondary | ICD-10-CM

## 2012-09-25 MED ORDER — PEG-KCL-NACL-NASULF-NA ASC-C 100 G PO SOLR: 1.0000 | ORAL | Status: DC

## 2012-09-25 MED ORDER — OMEPRAZOLE 20 MG PO CPDR: 20.0000 mg | DELAYED_RELEASE_CAPSULE | Freq: Every day | ORAL | Status: DC

## 2012-09-25 MED ORDER — LINACLOTIDE 290 MCG PO CAPS: 1.0000 | ORAL_CAPSULE | Freq: Every day | ORAL | Status: DC

## 2012-09-25 NOTE — Assessment & Plan Note (Signed)
47 year old male with persistent LLQ pain in the setting of prior diverticulitis episodes requiring laparoscopic sigmoid colectomy. However, most recent CT June 2014 negative for diverticulitis. Notes constipation as well, no rectal bleeding. He has never had a full colonoscopy, but a flex sig was performed in 2008 prior to colectomy. Discussed initial screening colonoscopy now for further assessment. Also of note, lower abdominal discomfort noted as "burning" intermittently. Question underlying constipation as culprit, possible adhesive disease or chronic abdominal pain.   Proceed with colonoscopy with Dr. Darrick Penna in the near future. The risks, benefits, and alternatives have been discussed in detail with the patient. They state understanding and desire to proceed.  Linzess 290 mcg daily

## 2012-09-25 NOTE — Patient Instructions (Addendum)
Start taking Linzess 1 capsule each morning on an empty stomach, 30 minutes prior to breakfast. This is for constipation. Monitor for loose stool.   Start taking Prilosec each morning, 30 minutes before breakfast. This is for reflux.   We have scheduled you for a colonoscopy with Dr. Darrick Penna in the near future.  Please review the reflux diet.  Further recommendations to follow.   Diet for Gastroesophageal Reflux Disease, Adult Reflux (acid reflux) is when acid from your stomach flows up into the esophagus. When acid comes in contact with the esophagus, the acid causes irritation and soreness (inflammation) in the esophagus. When reflux happens often or so severely that it causes damage to the esophagus, it is called gastroesophageal reflux disease (GERD). Nutrition therapy can help ease the discomfort of GERD. FOODS OR DRINKS TO AVOID OR LIMIT  Smoking or chewing tobacco. Nicotine is one of the most potent stimulants to acid production in the gastrointestinal tract.  Caffeinated and decaffeinated coffee and black tea.  Regular or low-calorie carbonated beverages or energy drinks (caffeine-free carbonated beverages are allowed).   Strong spices, such as black pepper, white pepper, red pepper, cayenne, curry powder, and chili powder.  Peppermint or spearmint.  Chocolate.  High-fat foods, including meats and fried foods. Extra added fats including oils, butter, salad dressings, and nuts. Limit these to less than 8 tsp per day.  Fruits and vegetables if they are not tolerated, such as citrus fruits or tomatoes.  Alcohol.  Any food that seems to aggravate your condition. If you have questions regarding your diet, call your caregiver or a registered dietitian. OTHER THINGS THAT MAY HELP GERD INCLUDE:   Eating your meals slowly, in a relaxed setting.  Eating 5 to 6 small meals per day instead of 3 large meals.  Eliminating food for a period of time if it causes distress.  Not lying  down until 3 hours after eating a meal.  Keeping the head of your bed raised 6 to 9 inches (15 to 23 cm) by using a foam wedge or blocks under the legs of the bed. Lying flat may make symptoms worse.  Being physically active. Weight loss may be helpful in reducing reflux in overweight or obese adults.  Wear loose fitting clothing EXAMPLE MEAL PLAN This meal plan is approximately 2,000 calories based on https://www.bernard.org/ meal planning guidelines. Breakfast   cup cooked oatmeal.  1 cup strawberries.  1 cup low-fat milk.  1 oz almonds. Snack  1 cup cucumber slices.  6 oz yogurt (made from low-fat or fat-free milk). Lunch  2 slice whole-wheat bread.  2 oz sliced [place].  2 tsp mayonnaise.  1 cup blueberries.  1 cup snap peas. Snack  6 whole-wheat crackers.  1 oz string cheese. Dinner   cup brown rice.  1 cup mixed veggies.  1 tsp olive oil.  3 oz grilled fish. Document Released: 02/28/2005 Document Revised: 05/23/2011 Document Reviewed: 01/14/2011 [facility] Patient Information 2014 [place], [place].

## 2012-09-25 NOTE — Progress Notes (Signed)
Primary Care Physician:  No primary provider on file. Primary Gastroenterologist:  Dr. Darrick Penna   Chief Complaint  Patient presents with  . Diverticulosis  . Abdominal Pain    HPI:   Pleasant 47 year old male presents today at the request of [facility] secondary to persistent abdominal pain. He has a history notable for recurrent sigmoid diverticulitis, requiring lap sigmoid colectomy by Dr. Lovell Sheehan in 2008. He has had a flex sig in 2008 by Dr. Darrick Penna, noting multiple diverticula beginning 20 cm from anal verge and extending to 50 cm from anal verge.   Reports LLQ discomfort similar to diverticulitis episodes in the past. Depending what he eats, has lower abdominal burning beneath belt line. LLQ discomfort is constant. Lower abdomen intermittent. Fried foods, greasy, fast food, increased meat, steak, taking all meds aggravates. Medication aggravates. No rectal bleeding. Possible recurrent diverticulitis March 2012 on CT. Notes constipation. BMs sometimes small, sometimes every other day. Not productive. Been on a juice diet with water for about a month. Portioning out meals in evenings. 210 at beginning of year, now 45. Intentional weight loss. Has gotten up in middle of night to hold pillow to his stomach. No fever or chills. Occasional reflux, no dysphagia. Tums about once to twice a week. No melena. Constipation 4-5 months.    Last CT September 05, 2012 without evidence of diverticulitis.   Past Medical History  Diagnosis Date  . Asthma   . Diverticulitis     s/p sigmoid colectomy  . GERD (gastroesophageal reflux disease)   . Arthritis   . Avascular necrosis of bones of both hips   . IBS (irritable bowel syndrome)     Past Surgical History  Procedure Laterality Date  . Tonsillectomy    . Laparoscopic sigmoid colectomy  09/11/2006  . Sigmoidoscopy  08/14/2006    [id number] diverticula seen beginning 20 cm from the anal verge and extending to 50 cm from the anal verge/Normal  retroflexed view of the rectum.    Current Outpatient Prescriptions  Medication Sig Dispense Refill  . albuterol (PROVENTIL) (2.5 MG/3ML) 0.083% nebulizer solution Take 2.5 mg by nebulization every 4 (four) hours as needed. For shortness of breath      . atorvastatin (LIPITOR) 40 MG tablet Take 20-40 mg by mouth at bedtime.      . budesonide-formoterol (SYMBICORT) 160-4.5 MCG/ACT inhaler Inhale 2 puffs into the lungs 2 (two) times daily. Pt currently out of medication but normally takes the medication everyday      . ipratropium (ATROVENT) 0.02 % nebulizer solution Take 500 mcg by nebulization every 4 (four) hours as needed. For shortness of breath        . traMADol (ULTRAM) 50 MG tablet Take 25-50 mg by mouth daily as needed for pain.      Marland Kitchen albuterol (PROVENTIL HFA;VENTOLIN HFA) 108 (90 BASE) MCG/ACT inhaler Inhale 2 puffs into the lungs every 4 (four) hours as needed for wheezing.  1 Inhaler  0  . Linaclotide (LINZESS) 290 MCG CAPS Take 1 capsule by mouth daily. Take 30 minutes before breakfast.  30 capsule  5  . omeprazole (PRILOSEC) 20 MG capsule Take 1 capsule (20 mg total) by mouth daily.  30 capsule  3  . peg 3350 powder (MOVIPREP) 100 G SOLR Take 1 kit (100 g total) by mouth as directed. PHARMACIST USE THE FOLLOWING FOR PATIENT DISCOUNT BIN: [id number] GROUP: [id number] ID: [id number] PATIENTS WILL SAVE UP TO $10 ON THEIR OUT-OF-POCKET EXPENSE FOR PROCESSING QUESTIONS, CALL [phone]  1 kit  0   No current facility-administered medications for this visit.    Allergies as of 09/25/2012 - Review Complete 09/25/2012  Allergen Reaction Noted  . Dextromethorphan Other (See Comments) 02/05/2011  . Dm (dextromethorphan-guaifenesin) Other (See Comments) 09/24/2010  . Lactaid (lactase) Diarrhea, Nausea And Vomiting, and Other (See Comments) 09/05/2012    Family History  Problem Relation Age of Onset  . Colon polyps Mother     pre-cancerous    History   Social History  .  Marital Status: Married    Spouse Name: N/A    Number of Children: N/A  . Years of Education: N/A   Occupational History  . [employer]    Social History Main Topics  . Smoking status: Current Some Day Smoker    Types: Cigarettes  . Smokeless tobacco: Not on file  . Alcohol Use: No  . Drug Use: No  . Sexually Active: Not on file   Other Topics Concern  . Not on file   Social History Narrative  . No narrative on file    Review of Systems: Negative unless mentioned in HPI.  Physical Exam: BP 115/72  Pulse 77  Temp(Src) 97.4 F (36.3 C) (Oral)  Ht 5\' 9"  (1.753 m)  Wt 192 lb 12.8 oz (87.454 kg)  BMI 28.46 kg/m2 General:   Alert and oriented. Pleasant and cooperative. Well-nourished and well-developed.  Head:  Normocephalic and atraumatic. Eyes:  Without icterus, sclera clear and conjunctiva pink.  Ears:  Normal auditory acuity. Nose:  No deformity, discharge,  or lesions. Mouth:  No deformity or lesions, oral mucosa pink.  Neck:  Supple, without mass or thyromegaly. Lungs:  Clear to auscultation bilaterally. No wheezes, rales, or rhonchi. No distress.  Heart:  S1, S2 present without murmurs appreciated.  Abdomen:  +BS, soft, non-tender and non-distended. "aware" of palpation LLQ and lower abdomen. NEGATIVE CARNETT'S SIGN. No HSM noted. No guarding or rebound. No masses appreciated.  Rectal:  Deferred  Msk:  Symmetrical without gross deformities. Normal posture. Extremities:  Without clubbing or edema. Neurologic:  Alert and  oriented x4;  grossly normal neurologically. Skin:  Intact without significant lesions or rashes. Cervical Nodes:  No significant cervical adenopathy. Psych:  Alert and cooperative. Normal mood and affect.  Lab Results  Component Value Date   WBC 9.1 09/05/2012   HGB 14.9 09/05/2012   HCT 43.4 09/05/2012   MCV 90.8 09/05/2012   PLT 224 09/05/2012

## 2012-09-25 NOTE — Assessment & Plan Note (Signed)
Linzess 290 mcg daily.

## 2012-09-25 NOTE — Assessment & Plan Note (Signed)
Tums several times a week. No concerning symptoms. Start Prilosec once daily, GERD diet provided.

## 2012-09-26 NOTE — Progress Notes (Signed)
No PCP on File 

## 2012-10-02 ENCOUNTER — Telehealth: Payer: Self-pay

## 2012-10-02 ENCOUNTER — Encounter (HOSPITAL_COMMUNITY)
Admission: RE | Disposition: A | Discharge status: Home / Self Care | Payer: Self-pay | Source: Ambulatory Visit | Attending: Gastroenterology

## 2012-10-02 ENCOUNTER — Encounter (HOSPITAL_COMMUNITY): Payer: Self-pay | Admitting: *Deleted

## 2012-10-02 ENCOUNTER — Ambulatory Visit (HOSPITAL_COMMUNITY)
Admission: RE | Admit: 2012-10-02 | Discharge: 2012-10-02 | Disposition: A | Discharge status: Home / Self Care | Payer: Federal, State, Local not specified - PPO | Source: Ambulatory Visit | Attending: Gastroenterology | Admitting: Gastroenterology

## 2012-10-02 DIAGNOSIS — K648 Other hemorrhoids: Secondary | ICD-10-CM | POA: Insufficient documentation

## 2012-10-02 DIAGNOSIS — K5730 Diverticulosis of large intestine without perforation or abscess without bleeding: Secondary | ICD-10-CM

## 2012-10-02 DIAGNOSIS — Z8719 Personal history of other diseases of the digestive system: Secondary | ICD-10-CM | POA: Insufficient documentation

## 2012-10-02 DIAGNOSIS — R109 Unspecified abdominal pain: Secondary | ICD-10-CM | POA: Insufficient documentation

## 2012-10-02 DIAGNOSIS — K5909 Other constipation: Secondary | ICD-10-CM | POA: Insufficient documentation

## 2012-10-02 DIAGNOSIS — K5900 Constipation, unspecified: Secondary | ICD-10-CM

## 2012-10-02 DIAGNOSIS — Z9049 Acquired absence of other specified parts of digestive tract: Secondary | ICD-10-CM | POA: Insufficient documentation

## 2012-10-02 DIAGNOSIS — R1032 Left lower quadrant pain: Secondary | ICD-10-CM

## 2012-10-02 HISTORY — PX: COLONOSCOPY: SHX5424

## 2012-10-02 SURGERY — COLONOSCOPY
Anesthesia: Moderate Sedation

## 2012-10-02 MED ORDER — MIDAZOLAM HCL 5 MG/5ML IJ SOLN
INTRAMUSCULAR | Status: AC
  Filled 2012-10-02: qty 10

## 2012-10-02 MED ORDER — LINACLOTIDE 145 MCG PO CAPS: ORAL_CAPSULE | ORAL | Status: DC

## 2012-10-02 MED ORDER — PROMETHAZINE HCL 25 MG/ML IJ SOLN
INTRAMUSCULAR | Status: AC
  Filled 2012-10-02: qty 1

## 2012-10-02 MED ORDER — MEPERIDINE HCL 100 MG/ML IJ SOLN
INTRAMUSCULAR | Status: DC | PRN
  Administered 2012-10-02 (×2): 25 mg via INTRAVENOUS

## 2012-10-02 MED ORDER — SODIUM CHLORIDE 0.9 % IJ SOLN
INTRAMUSCULAR | Status: AC
  Filled 2012-10-02: qty 10

## 2012-10-02 MED ORDER — MEPERIDINE HCL 100 MG/ML IJ SOLN
INTRAMUSCULAR | Status: AC
  Filled 2012-10-02: qty 1

## 2012-10-02 MED ORDER — MEPERIDINE HCL 100 MG/ML IJ SOLN
INTRAMUSCULAR | Status: AC
  Filled 2012-10-02: qty 2

## 2012-10-02 MED ORDER — STERILE WATER FOR IRRIGATION IR SOLN
Status: DC | PRN
  Administered 2012-10-02: 11:00:00

## 2012-10-02 MED ORDER — MIDAZOLAM HCL 5 MG/5ML IJ SOLN
INTRAMUSCULAR | Status: DC | PRN
  Administered 2012-10-02 (×2): 2 mg via INTRAVENOUS
  Administered 2012-10-02: 1 mg via INTRAVENOUS

## 2012-10-02 MED ORDER — SODIUM CHLORIDE 0.9 % IV SOLN
INTRAVENOUS | Status: DC
  Administered 2012-10-02: 10:00:00 via INTRAVENOUS

## 2012-10-02 MED ORDER — PROMETHAZINE HCL 25 MG/ML IJ SOLN
INTRAMUSCULAR | Status: DC | PRN
  Administered 2012-10-02: 12.5 mg via INTRAVENOUS

## 2012-10-02 NOTE — Telephone Encounter (Signed)
Pt 's wife called and I gave her the info that Dr.Fields advised, and she gave me the fax number of [phone] to fax the note to and put attn Debra Bulloch or Wandra Arthurs. Note had been faxed.

## 2012-10-02 NOTE — Telephone Encounter (Signed)
Pt's wife is supposed to call and let me know what number to fax the work note to.

## 2012-10-02 NOTE — Telephone Encounter (Signed)
PLEASE CALL PT. He may return to work JUL 24. 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

## 2012-10-02 NOTE — H&P (Signed)
Primary Care Physician:  No PCP Per Patient Primary Gastroenterologist:  Dr. Darrick Penna  Pre-Procedure History & Physical: HPI:  Robert Gordon is a 47 y.o. male here for ABDOMINAL PAIN/CHANGE IN BOWEL HABITS.  Past Medical History  Diagnosis Date  . Asthma   . Diverticulitis     s/p sigmoid colectomy  . GERD (gastroesophageal reflux disease)   . Arthritis   . Avascular necrosis of bones of both hips   . IBS (irritable bowel syndrome)     Past Surgical History  Procedure Laterality Date  . Tonsillectomy    . Laparoscopic sigmoid colectomy  09/11/2006  . Sigmoidoscopy  08/14/2006    [id number] diverticula seen beginning 20 cm from the anal verge and extending to 50 cm from the anal verge/Normal retroflexed view of the rectum.    Prior to Admission medications   Medication Sig Start Date End Date Taking? Authorizing Provider  atorvastatin (LIPITOR) 40 MG tablet Take 20-40 mg by mouth at bedtime.   Yes Historical Provider, MD  budesonide-formoterol (SYMBICORT) 160-4.5 MCG/ACT inhaler Inhale 2 puffs into the lungs 2 (two) times daily. Pt currently out of medication but normally takes the medication everyday   Yes Historical Provider, MD  ipratropium (ATROVENT) 0.02 % nebulizer solution Take 500 mcg by nebulization every 4 (four) hours as needed. For shortness of breath     Yes Historical Provider, MD  Linaclotide (LINZESS) 290 MCG CAPS Take 1 capsule by mouth daily. Take 30 minutes before breakfast. 09/25/12  Yes Nira Retort, NP  peg 3350 powder (MOVIPREP) 100 G SOLR Take 1 kit (100 g total) by mouth as directed. PHARMACIST USE THE FOLLOWING FOR PATIENT DISCOUNT BIN: [id number] GROUP: [id number] ID: [id number] PATIENTS WILL SAVE UP TO $10 ON THEIR OUT-OF-POCKET EXPENSE FOR PROCESSING QUESTIONS, CALL [phone] 09/25/12  Yes West Bali, MD  traMADol (ULTRAM) 50 MG tablet Take 25-50 mg by mouth daily as needed for pain.   Yes Historical Provider, MD  albuterol (PROVENTIL  HFA;VENTOLIN HFA) 108 (90 BASE) MCG/ACT inhaler Inhale 2 puffs into the lungs every 4 (four) hours as needed for wheezing. 01/16/11 09/05/12  Glynn Octave, MD  albuterol (PROVENTIL) (2.5 MG/3ML) 0.083% nebulizer solution Take 2.5 mg by nebulization every 4 (four) hours as needed. For shortness of breath    Historical Provider, MD  omeprazole (PRILOSEC) 20 MG capsule Take 1 capsule (20 mg total) by mouth daily. 09/25/12   Nira Retort, NP    Allergies as of 09/25/2012 - Review Complete 09/25/2012  Allergen Reaction Noted  . Dextromethorphan Other (See Comments) 02/05/2011  . Dm (dextromethorphan-guaifenesin) Other (See Comments) 09/24/2010  . Lactaid (lactase) Diarrhea, Nausea And Vomiting, and Other (See Comments) 09/05/2012    Family History  Problem Relation Age of Onset  . Colon polyps Mother     pre-cancerous    History   Social History  . Marital Status: Married    Spouse Name: N/A    Number of Children: N/A  . Years of Education: N/A   Occupational History  . [employer]    Social History Main Topics  . Smoking status: Current Some Day Smoker    Types: Cigarettes  . Smokeless tobacco: Not on file  . Alcohol Use: No  . Drug Use: No  . Sexually Active: Not on file   Other Topics Concern  . Not on file   Social History Narrative  . No narrative on file    Review of Systems: See HPI, otherwise negative ROS  Physical Exam: BP 113/77  Pulse 80  Resp 18  SpO2 98% General:   Alert,  pleasant and cooperative in NAD Head:  Normocephalic and atraumatic. Neck:  Supple; Lungs:  Clear throughout to auscultation.    Heart:  Regular rate and rhythm. Abdomen:  Soft, nontender and nondistended. Normal bowel sounds, without guarding, and without rebound.   Neurologic:  Alert and  oriented x4;  grossly normal neurologically.  Impression/Plan:     ABDOMINAL PAIN/CHANGE IN BOWEL HABITS  PLAN:  1. TCS TODAY

## 2012-10-02 NOTE — Op Note (Signed)
[facility] [address] [place], [address]   COLONOSCOPY PROCEDURE REPORT  PATIENT: Robert Gordon, Robert Gordon  MR#: [id number] BIRTHDATE: [birth year] , 46  yrs. old GENDER: Male ENDOSCOPIST: Jonette Eva, MD REFERRED BY: PROCEDURE DATE:  10/02/2012 PROCEDURE:   ILEOColonoscopy, diagnostic INDICATIONS:Constipation and pmhx: complicated sigmoid diverticulitis -COLECTOMY IN 2008. MEDICATIONS: PREOP: Promethazine (Phenergan) 12.5mg  IV, Demerol 50 mg IV, and Versed 5 mg IV  DESCRIPTION OF PROCEDURE:    Physical exam was performed.  Informed consent was obtained from the patient after explaining the benefits, risks, and alternatives to procedure.  The patient was connected to monitor and placed in left lateral position. Continuous oxygen was provided by nasal cannula and IV medicine administered through an indwelling cannula.  After administration of sedation and rectal exam, the patients rectum was intubated and the EC-[id number] ([id number])  colonoscope was advanced under direct visualization to the ileum.  The scope was removed slowly by carefully examining the color, texture, anatomy, and integrity mucosa on the way out.  The patient was recovered in endoscopy and discharged home in satisfactory condition.    COLON FINDINGS: NORMAL ILEUM- 10-15 CM VISUALIZED. Mild diverticulosis was noted in the transverse colon, descending colon, and sigmoid colon.  , There was evidence of a normal appearing prior surgical anastomosis in the sigmoid colon.  Small internal hemorrhoids were found. A normal appearing cecum, ileocecal valve, and appendiceal orifice were identified.  No polyps or cancers were seen.  PREP QUALITY: excellent.  CECAL W/D TIME: 11 minutes COMPLICATIONS: None  ENDOSCOPIC IMPRESSION: 1.   Mild diverticulosis in the transverse colon, descending colon, and sigmoid colon 2.   NORMAL prior surgical anastomosis in the sigmoid colon 3.   Small internal  hemorrhoids  RECOMMENDATIONS: DRINK WATER TO KEEP YOUR URINE LIGHT YELLOW. CONTINUE LINZESS 145 mcg 30 mins before breakfast.  DO NOT TAKE THE 290 MCG TABLETS.  HOLD FOR DIARRHEA. ADD BENEFIBER POWDER OR 1 PACKET ONCE DAILY FOR 3 DAYS THEN TWICE DAILY FOR 3 DAYS THEN THREE TIMES A DAY. FOLLOW A HIGH FIBER DIET. OPV IN 2 MOS  Next colonoscopy in 10 years.       _______________________________ Rosalie DoctorJonette Eva, MD 10/02/2012 1:53 PM

## 2012-10-02 NOTE — Telephone Encounter (Signed)
Pt's wife came by the office and requested a work excuse for tomorrow. She said he is very sensitive to drugs and he will be our of it tomorrow from the drugs he had at the hospital for the procedure today. I told her that everyone normally goes back to work the next day and I will have to check with Dr. Darrick Penna about the note. She said she will call back with number to fax the note to if it is approved.

## 2012-10-04 ENCOUNTER — Encounter (HOSPITAL_COMMUNITY): Payer: Self-pay | Admitting: Gastroenterology

## 2012-12-05 ENCOUNTER — Telehealth: Payer: Self-pay | Admitting: Gastroenterology

## 2012-12-05 ENCOUNTER — Ambulatory Visit: Payer: Federal, State, Local not specified - PPO | Admitting: Gastroenterology

## 2012-12-05 NOTE — Telephone Encounter (Signed)
Pt was a no show

## 2012-12-05 NOTE — Telephone Encounter (Signed)
REVIEWED.  

## 2013-05-28 NOTE — Progress Notes (Signed)
REVIEWED. TCS JUL 2014 TICS/IH, NL ANASTOMOSIS

## 2018-04-18 ENCOUNTER — Other Ambulatory Visit: Payer: Self-pay

## 2018-04-18 ENCOUNTER — Emergency Department (HOSPITAL_COMMUNITY): Payer: Federal, State, Local not specified - PPO

## 2018-04-18 ENCOUNTER — Encounter (HOSPITAL_COMMUNITY): Payer: Self-pay | Admitting: Emergency Medicine

## 2018-04-18 ENCOUNTER — Emergency Department (HOSPITAL_COMMUNITY)
Admission: EM | Admit: 2018-04-18 | Discharge: 2018-04-18 | Disposition: A | Discharge status: Home / Self Care | Payer: Federal, State, Local not specified - PPO | Attending: Emergency Medicine | Admitting: Emergency Medicine

## 2018-04-18 DIAGNOSIS — R42 Dizziness and giddiness: Secondary | ICD-10-CM | POA: Diagnosis not present

## 2018-04-18 DIAGNOSIS — R000 Tachycardia, unspecified: Secondary | ICD-10-CM | POA: Insufficient documentation

## 2018-04-18 DIAGNOSIS — R51 Headache: Secondary | ICD-10-CM | POA: Insufficient documentation

## 2018-04-18 DIAGNOSIS — Z5321 Procedure and treatment not carried out due to patient leaving prior to being seen by health care provider: Secondary | ICD-10-CM | POA: Insufficient documentation

## 2018-04-18 HISTORY — DX: Hyperlipidemia, unspecified: E78.5

## 2018-04-18 LAB — BASIC METABOLIC PANEL
Anion gap: 7 (ref 5–15)
BUN: 13 mg/dL (ref 6–20)
CHLORIDE: 110 mmol/L (ref 98–111)
CO2: 24 mmol/L (ref 22–32)
CREATININE: 0.89 mg/dL (ref 0.61–1.24)
Calcium: 9.2 mg/dL (ref 8.9–10.3)
GFR calc non Af Amer: >60 mL/min (ref >60)
Glucose, Bld: 97 mg/dL (ref 70–99)
Potassium: 4.1 mmol/L (ref 3.5–5.1)
Sodium: 141 mmol/L (ref 135–145)

## 2018-04-18 LAB — CBC
HCT: 47.7 % (ref 39.0–52.0)
Hemoglobin: 14.9 g/dL (ref 13.0–17.0)
MCH: 29.3 pg (ref 26.0–34.0)
MCHC: 31.2 g/dL (ref 30.0–36.0)
MCV: 93.9 fL (ref 80.0–100.0)
NRBC: 0 % (ref 0.0–0.2)
Platelets: 247 10*3/uL (ref 150–400)
RBC: 5.08 MIL/uL (ref 4.22–5.81)
RDW: 12.8 % (ref 11.5–15.5)
WBC: 9.6 10*3/uL (ref 4.0–10.5)

## 2018-04-18 LAB — TROPONIN I: Troponin I: <0.03 ng/mL (ref <0.03)

## 2018-04-18 MED ORDER — SODIUM CHLORIDE 0.9% FLUSH: 3.0000 mL | Freq: Once | INTRAVENOUS | Status: DC

## 2018-04-18 NOTE — ED Notes (Signed)
Pt told registration he couldn't wait any longer

## 2018-04-18 NOTE — ED Notes (Signed)
ekg handed off to Dr. Clarene Duke

## 2018-04-18 NOTE — ED Triage Notes (Signed)
Pt has felt like his HR has been intermittently elevated for past few days.  On Saturday pt got a reading from home bp machine that his hr was in 140's.  Denies felling like his heart is racing right this moment, but does report headache and light headedness

## 2018-07-27 ENCOUNTER — Other Ambulatory Visit: Payer: Self-pay

## 2018-07-27 ENCOUNTER — Emergency Department (HOSPITAL_COMMUNITY)
Admission: EM | Admit: 2018-07-27 | Discharge: 2018-07-27 | Disposition: A | Discharge status: Home / Self Care | Payer: Federal, State, Local not specified - PPO | Attending: Emergency Medicine | Admitting: Emergency Medicine

## 2018-07-27 ENCOUNTER — Encounter (HOSPITAL_COMMUNITY): Payer: Self-pay | Admitting: Emergency Medicine

## 2018-07-27 DIAGNOSIS — J45909 Unspecified asthma, uncomplicated: Secondary | ICD-10-CM | POA: Diagnosis not present

## 2018-07-27 DIAGNOSIS — Y929 Unspecified place or not applicable: Secondary | ICD-10-CM | POA: Diagnosis not present

## 2018-07-27 DIAGNOSIS — Z79899 Other long term (current) drug therapy: Secondary | ICD-10-CM | POA: Insufficient documentation

## 2018-07-27 DIAGNOSIS — X58XXXA Exposure to other specified factors, initial encounter: Secondary | ICD-10-CM | POA: Insufficient documentation

## 2018-07-27 DIAGNOSIS — T162XXA Foreign body in left ear, initial encounter: Secondary | ICD-10-CM | POA: Diagnosis not present

## 2018-07-27 DIAGNOSIS — Y999 Unspecified external cause status: Secondary | ICD-10-CM | POA: Diagnosis not present

## 2018-07-27 DIAGNOSIS — Y939 Activity, unspecified: Secondary | ICD-10-CM | POA: Insufficient documentation

## 2018-07-27 MED ORDER — LIDOCAINE HCL (PF) 2 % IJ SOLN
INTRAMUSCULAR | Status: AC
  Administered 2018-07-27: 2 mL
  Filled 2018-07-27: qty 10

## 2018-07-27 NOTE — ED Provider Notes (Signed)
[facility] EMERGENCY DEPARTMENT Provider Note   CSN: [id number] Arrival date & time: 07/27/18  2103    History   Chief Complaint Chief Complaint  Patient presents with  . Foreign Body in Ear    HPI Robert Gordon is a 53 y.o. male.     The history is provided by the patient. No language interpreter was used.  Foreign Body in Ear  This is a new problem. The current episode started 1 to 2 hours ago. The problem occurs constantly. The problem has been gradually worsening. Nothing aggravates the symptoms. Nothing relieves the symptoms. He has tried nothing for the symptoms. The treatment provided no relief.  Pt reports a bug flew in his ear.   Past Medical History:  Diagnosis Date  . Arthritis   . Asthma   . Avascular necrosis of bones of both hips (HCC)   . Diverticulitis    s/p sigmoid colectomy  . GERD (gastroesophageal reflux disease)   . Hyperlipidemia   . IBS (irritable bowel syndrome)     Patient Active Problem List   Diagnosis Date Noted  . LLQ pain 09/25/2012  . Unspecified constipation 09/25/2012  . ALLERGIC RHINITIS 11/28/2008  . INTRINSIC ASTHMA, UNSPECIFIED 11/28/2008  . GERD 11/28/2008  . DIVERTICULITIS, COLON 11/28/2008    Past Surgical History:  Procedure Laterality Date  . COLONOSCOPY N/A 10/02/2012   Procedure: COLONOSCOPY;  Surgeon: West Bali, MD;  Location: [facility];  Service: Endoscopy;  Laterality: N/A;  11:00  . HIP SURGERY Left   . LAPAROSCOPIC SIGMOID COLECTOMY  09/11/2006  . SIGMOIDOSCOPY  08/14/2006   CHE:NIDPOEUM diverticula seen beginning 20 cm from the anal verge and extending to 50 cm from the anal verge/Normal retroflexed view of the rectum.  . TONSILLECTOMY          Home Medications    Prior to Admission medications   Medication Sig Start Date End Date Taking? Authorizing Provider  albuterol (PROVENTIL HFA;VENTOLIN HFA) 108 (90 BASE) MCG/ACT inhaler Inhale 2 puffs into the lungs every 4 (four) hours as needed  for wheezing. 01/16/11 09/05/12  Rancour, Jeannett Senior, MD  albuterol (PROVENTIL) (2.5 MG/3ML) 0.083% nebulizer solution Take 2.5 mg by nebulization every 4 (four) hours as needed. For shortness of breath    [provider]  atorvastatin (LIPITOR) 40 MG tablet Take 20-40 mg by mouth at bedtime.    [provider]  budesonide-formoterol (SYMBICORT) 160-4.5 MCG/ACT inhaler Inhale 2 puffs into the lungs 2 (two) times daily. Pt currently out of medication but normally takes the medication everyday    [provider]  ipratropium (ATROVENT) 0.02 % nebulizer solution Take 500 mcg by nebulization every 4 (four) hours as needed. For shortness of breath      [provider]  Linaclotide (LINZESS) 145 MCG CAPS 1 PO 30 mins prior to your first meal 10/02/12   Fields, Darleene Cleaver, MD  omeprazole (PRILOSEC) 20 MG capsule Take 1 capsule (20 mg total) by mouth daily. 09/25/12   Gelene Mink, NP  traMADol (ULTRAM) 50 MG tablet Take 25-50 mg by mouth daily as needed for pain.    [provider]    Family History Family History  Problem Relation Age of Onset  . Colon polyps Mother        pre-cancerous    Social History Social History   Tobacco Use  . Smoking status: Current Some Day Smoker    Packs/day: 0.50    Types: Cigarettes  . Smokeless  tobacco: Never Used  Substance Use Topics  . Alcohol use: No  . Drug use: No     Allergies   Dextromethorphan; Dm [dextromethorphan-guaifenesin]; and Lactaid [lactase]   Review of Systems Review of Systems  HENT: Positive for ear pain.   All other systems reviewed and are negative.    Physical Exam Updated Vital Signs BP (!) 146/91 (BP Location: Right Arm)   Pulse (!) 103   Temp 98.1 F (36.7 C) (Oral)   Resp 18   Ht 5\' 9"  (1.753 m)   Wt 95.3 kg   SpO2 98%   BMI 31.01 kg/m   Physical Exam Vitals signs reviewed.  HENT:     Ears:     Comments: Bug left ear canal.  Neck:     Musculoskeletal: Normal range  of motion.  Cardiovascular:     Rate and Rhythm: Normal rate.     Pulses: Normal pulses.  Pulmonary:     Effort: Pulmonary effort is normal.  Skin:    General: Skin is warm.  Neurological:     General: No focal deficit present.     Mental Status: He is alert.  Psychiatric:        Mood and Affect: Mood normal.      ED Treatments / Results  Labs (all labs ordered are listed, but only abnormal results are displayed) Labs Reviewed - No data to display  EKG None  Radiology No results found.  Procedures .Foreign Body Removal Date/Time: 07/27/2018 10:41 PM Performed by: Elson AreasSofia, Danelia Snodgrass K, PA-C Authorized by: Elson AreasSofia, Melania Kirks K, PA-C  Consent: Verbal consent obtained. Risks and benefits: risks, benefits and alternatives were discussed Consent given by: patient Patient consent: the patient's understanding of the procedure matches consent given Patient identity confirmed: verbally with patient Time out: Immediately prior to procedure a "time out" was called to verify the correct patient, procedure, equipment, support staff and site/side marked as required. Body area: ear  Anesthesia: Local Anesthetic: lidocaine 1% without epinephrine  Sedation: Patient sedated: no  Localization method: ENT speculum Removal mechanism: alligator forceps Complexity: complex 1 objects recovered. Objects recovered: bug with wings Post-procedure assessment: foreign body removed Patient tolerance: Patient tolerated the procedure well with no immediate complications   (including critical care time)  Medications Ordered in ED Medications  lidocaine (XYLOCAINE) 2 % injection (2 mLs  Given 07/27/18 2143)     Initial Impression / Assessment and Plan / ED Course  I have reviewed the triage vital signs and the nursing notes.  Pertinent labs & imaging results that were available during my care of the patient were reviewed by me and considered in my medical decision making (see chart for details).         MDM  Pt advised canal may be irritated for several days  Final Clinical Impressions(s) / ED Diagnoses   Final diagnoses:  Foreign body of left ear, initial encounter   An After Visit Summary was printed and given to the patient. ED Discharge Orders    None       Osie CheeksSofia, Margene Cherian K, PA-C 07/27/18 2244    Gerhard MunchLockwood, Robert, MD 07/27/18 2245

## 2018-07-27 NOTE — Discharge Instructions (Signed)
Return if any problems.

## 2018-07-27 NOTE — ED Triage Notes (Addendum)
Pt states "a bug flew into my left ear about 1 hour ago." Pt unsure what kind of insect it is.

## 2020-05-25 ENCOUNTER — Other Ambulatory Visit: Payer: Self-pay

## 2020-05-25 ENCOUNTER — Emergency Department (HOSPITAL_COMMUNITY)
Admission: EM | Admit: 2020-05-25 | Discharge: 2020-05-25 | Disposition: A | Discharge status: Home / Self Care | Payer: No Typology Code available for payment source | Attending: Emergency Medicine | Admitting: Emergency Medicine

## 2020-05-25 ENCOUNTER — Emergency Department (HOSPITAL_COMMUNITY): Payer: No Typology Code available for payment source

## 2020-05-25 ENCOUNTER — Encounter (HOSPITAL_COMMUNITY): Payer: Self-pay

## 2020-05-25 DIAGNOSIS — J45909 Unspecified asthma, uncomplicated: Secondary | ICD-10-CM | POA: Insufficient documentation

## 2020-05-25 DIAGNOSIS — R002 Palpitations: Secondary | ICD-10-CM | POA: Diagnosis present

## 2020-05-25 DIAGNOSIS — F17210 Nicotine dependence, cigarettes, uncomplicated: Secondary | ICD-10-CM | POA: Insufficient documentation

## 2020-05-25 LAB — CBC WITH DIFFERENTIAL/PLATELET
Abs Immature Granulocytes: 0.07 10*3/uL (ref 0.00–0.07)
Basophils Absolute: 0.1 10*3/uL (ref 0.0–0.1)
Basophils Relative: 1 %
Eosinophils Absolute: 0.4 10*3/uL (ref 0.0–0.5)
Eosinophils Relative: 3 %
HCT: 49 % (ref 39.0–52.0)
Hemoglobin: 15.7 g/dL (ref 13.0–17.0)
Immature Granulocytes: 1 %
Lymphocytes Relative: 34 %
Lymphs Abs: 3.8 10*3/uL (ref 0.7–4.0)
MCH: 30.4 pg (ref 26.0–34.0)
MCHC: 32 g/dL (ref 30.0–36.0)
MCV: 94.8 fL (ref 80.0–100.0)
Monocytes Absolute: 0.6 10*3/uL (ref 0.1–1.0)
Monocytes Relative: 6 %
Neutro Abs: 6.1 10*3/uL (ref 1.7–7.7)
Neutrophils Relative %: 55 %
Platelets: 263 10*3/uL (ref 150–400)
RBC: 5.17 MIL/uL (ref 4.22–5.81)
RDW: 13.3 % (ref 11.5–15.5)
WBC: 11 10*3/uL — ABNORMAL HIGH (ref 4.0–10.5)
nRBC: 0 % (ref 0.0–0.2)

## 2020-05-25 LAB — COMPREHENSIVE METABOLIC PANEL
ALT: 31 U/L (ref 0–44)
AST: 24 U/L (ref 15–41)
Albumin: 4.2 g/dL (ref 3.5–5.0)
Alkaline Phosphatase: 62 U/L (ref 38–126)
Anion gap: 12 (ref 5–15)
BUN: 12 mg/dL (ref 6–20)
CO2: 23 mmol/L (ref 22–32)
Calcium: 9.2 mg/dL (ref 8.9–10.3)
Chloride: 105 mmol/L (ref 98–111)
Creatinine, Ser: 1.02 mg/dL (ref 0.61–1.24)
GFR, Estimated: >60 mL/min (ref >60)
Glucose, Bld: 86 mg/dL (ref 70–99)
Potassium: 4 mmol/L (ref 3.5–5.1)
Sodium: 140 mmol/L (ref 135–145)
Total Bilirubin: 0.6 mg/dL (ref 0.3–1.2)
Total Protein: 7.6 g/dL (ref 6.5–8.1)

## 2020-05-25 LAB — TROPONIN I (HIGH SENSITIVITY)
Troponin I (High Sensitivity): 4 ng/L (ref <18)
Troponin I (High Sensitivity): 4 ng/L (ref <18)

## 2020-05-25 NOTE — ED Triage Notes (Signed)
Pt presents to ED with complaints of heart palpitations x 3-4 weeks off and on. Pt states nothing triggers it.

## 2020-05-25 NOTE — ED Provider Notes (Signed)
[facility] EMERGENCY DEPARTMENT Provider Note   CSN: [id number] Arrival date & time: 05/25/20  1201     History Chief Complaint  Patient presents with  . Palpitations    Robert Gordon is a 55 y.o. male.  The history is provided by the patient. No language interpreter was used.  Palpitations Palpitations quality:  Fast Onset quality:  Gradual Timing:  Constant Progression:  Worsening Chronicity:  New Context: caffeine   Relieved by:  Nothing Worsened by:  Nothing Ineffective treatments:  None tried Associated symptoms: no weakness   Pt complains of episodes of heart racing. Pt reports he has had episodes in the past.  Pt reports to significant amount of caffeine daily.  Pt went to Urgent care and was sent here      Past Medical History:  Diagnosis Date  . Arthritis   . Asthma   . Avascular necrosis of bones of both hips (HCC)   . Diverticulitis    s/p sigmoid colectomy  . GERD (gastroesophageal reflux disease)   . Hyperlipidemia   . IBS (irritable bowel syndrome)     Patient Active Problem List   Diagnosis Date Noted  . LLQ pain 09/25/2012  . Unspecified constipation 09/25/2012  . ALLERGIC RHINITIS 11/28/2008  . INTRINSIC ASTHMA, UNSPECIFIED 11/28/2008  . GERD 11/28/2008  . DIVERTICULITIS, COLON 11/28/2008    Past Surgical History:  Procedure Laterality Date  . COLONOSCOPY N/A 10/02/2012   Procedure: COLONOSCOPY;  Surgeon: West Bali, MD;  Location: [facility];  Service: Endoscopy;  Laterality: N/A;  11:00  . HIP SURGERY Left   . LAPAROSCOPIC SIGMOID COLECTOMY  09/11/2006  . SIGMOIDOSCOPY  08/14/2006   [id number] diverticula seen beginning 20 cm from the anal verge and extending to 50 cm from the anal verge/Normal retroflexed view of the rectum.  . TONSILLECTOMY         Family History  Problem Relation Age of Onset  . Colon polyps Mother        pre-cancerous    Social History   Tobacco Use  . Smoking status: Current Some Day  Smoker    Packs/day: 0.50    Types: Cigarettes  . Smokeless tobacco: Never Used  Substance Use Topics  . Alcohol use: No  . Drug use: No    Home Medications Prior to Admission medications   Medication Sig Start Date End Date Taking? Authorizing Provider  albuterol (PROVENTIL HFA;VENTOLIN HFA) 108 (90 BASE) MCG/ACT inhaler Inhale 2 puffs into the lungs every 4 (four) hours as needed for wheezing. 01/16/11 09/05/12 Yes Rancour, Jeannett Senior, MD  albuterol (PROVENTIL) (2.5 MG/3ML) 0.083% nebulizer solution Take 2.5 mg by nebulization every 4 (four) hours as needed. For shortness of breath   Yes [provider]  atorvastatin (LIPITOR) 40 MG tablet Take 20-40 mg by mouth at bedtime.   Yes [provider]  budesonide-formoterol (SYMBICORT) 160-4.5 MCG/ACT inhaler Inhale 2 puffs into the lungs 2 (two) times daily. Pt currently out of medication but normally takes the medication everyday   Yes [provider]  cyclobenzaprine (FLEXERIL) 5 MG tablet Take 5 mg by mouth 3 (three) times daily as needed for muscle spasms.   Yes [provider]  ipratropium (ATROVENT) 0.02 % nebulizer solution Take 500 mcg by nebulization every 4 (four) hours as needed. For shortness of breath   Yes [provider]  Linaclotide (LINZESS) 145 MCG CAPS 1 PO 30 mins prior to your first meal Patient not taking: Reported on 05/25/2020 10/02/12  Fields, Darleene Cleaver, MD  omeprazole (PRILOSEC) 20 MG capsule Take 1 capsule (20 mg total) by mouth daily. Patient not taking: Reported on 05/25/2020 09/25/12   Gelene Mink, NP    Allergies    Dextromethorphan, Dm [dextromethorphan-guaifenesin], and Lactaid [lactase]  Review of Systems   Review of Systems  Cardiovascular: Positive for palpitations.  Neurological: Negative for weakness.  All other systems reviewed and are negative.   Physical Exam Updated Vital Signs BP 104/78   Pulse 67   Temp 97.8 F (36.6 C) (Oral)   Resp 15   Ht 5\' 9"   (1.753 m)   Wt 90.7 kg   SpO2 100%   BMI 29.53 kg/m   Physical Exam Vitals and nursing note reviewed.  Constitutional:      Appearance: He is well-developed.  HENT:     Head: Normocephalic and atraumatic.  Eyes:     Conjunctiva/sclera: Conjunctivae normal.  Cardiovascular:     Rate and Rhythm: Normal rate and regular rhythm.     Heart sounds: No murmur heard.   Pulmonary:     Effort: Pulmonary effort is normal. No respiratory distress.     Breath sounds: Normal breath sounds.  Abdominal:     General: Abdomen is flat.     Palpations: Abdomen is soft.     Tenderness: There is no abdominal tenderness.  Musculoskeletal:        General: Normal range of motion.     Cervical back: Neck supple.  Skin:    General: Skin is warm and dry.  Neurological:     Mental Status: He is alert.  Psychiatric:        Mood and Affect: Mood normal.     ED Results / Procedures / Treatments   Labs (all labs ordered are listed, but only abnormal results are displayed) Labs Reviewed  CBC WITH DIFFERENTIAL/PLATELET - Abnormal; Notable for the following components:      Result Value   WBC 11.0 (*)    All other components within normal limits  COMPREHENSIVE METABOLIC PANEL  TROPONIN I (HIGH SENSITIVITY)  TROPONIN I (HIGH SENSITIVITY)  TROPONIN I (HIGH SENSITIVITY)  TROPONIN I (HIGH SENSITIVITY)    EKG EKG Interpretation  Date/Time:  Monday May 25 2020 12:12:18 EDT Ventricular Rate:  101 PR Interval:    QRS Duration: 91 QT Interval:  336 QTC Calculation: 436 R Axis:   80 Text Interpretation: Sinus tachycardia Minimal ST depression, inferior leads No significant change since last tracing Confirmed by [id number] [phone]) on 05/25/2020 12:17:43 PM   Radiology DG Chest Port 1 View  Result Date: 05/25/2020 CLINICAL DATA:  Chest pain EXAM: PORTABLE CHEST 1 VIEW COMPARISON:  04/18/2018 FINDINGS: The heart size and mediastinal contours are within normal limits. Both lungs are clear.  The visualized skeletal structures are unremarkable. IMPRESSION: No acute abnormality of the lungs in [facility] portable projection. Electronically Signed   By: 06/17/2018 M.D.   On: 05/25/2020 12:43    Procedures Procedures   Medications Ordered in ED Medications - No data to display  ED Course  I have reviewed the triage vital signs and the nursing notes.  Pertinent labs & imaging results that were available during my care of the patient were reviewed by me and considered in my medical decision making (see chart for details).    MDM Rules/Calculators/A&P                         MDM;  troponin is negative x 2.  Pt monitored  No afib or tachycardia.   Ekg from [facility] obtained and reviewed with Dr. Ruthy Dick,  Appears to be tachycardia.  Pt advised asa a day.  Schedule to see cardiology for evaluation.  Pt advised to decrease caffeine.   Final Clinical Impression(s) / ED Diagnoses Final diagnoses:  Palpitations    Rx / DC Orders ED Discharge Orders    None    An After Visit Summary was printed and given to the patient.    Elson Areas, PA-C 05/25/20 1711    Vanetta Mulders, MD 06/02/20 [phone]

## 2020-05-25 NOTE — Discharge Instructions (Signed)
Take a baby aspirin every day.  Decrease caffeine consumption. Schedule to see Cardiology for evaluation

## 2020-06-05 ENCOUNTER — Encounter (HOSPITAL_COMMUNITY): Payer: Self-pay | Admitting: Emergency Medicine

## 2020-06-05 ENCOUNTER — Other Ambulatory Visit: Payer: Self-pay

## 2020-06-05 ENCOUNTER — Emergency Department (HOSPITAL_COMMUNITY): Payer: No Typology Code available for payment source

## 2020-06-05 ENCOUNTER — Inpatient Hospital Stay (HOSPITAL_BASED_OUTPATIENT_CLINIC_OR_DEPARTMENT_OTHER): Payer: No Typology Code available for payment source

## 2020-06-05 ENCOUNTER — Inpatient Hospital Stay (HOSPITAL_COMMUNITY)
Admission: EM | Admit: 2020-06-05 | Discharge: 2020-06-05 | DRG: 310 | Disposition: A | Discharge status: Home / Self Care | Payer: No Typology Code available for payment source | Attending: Internal Medicine | Admitting: Internal Medicine

## 2020-06-05 DIAGNOSIS — K219 Gastro-esophageal reflux disease without esophagitis: Secondary | ICD-10-CM | POA: Diagnosis present

## 2020-06-05 DIAGNOSIS — J45909 Unspecified asthma, uncomplicated: Secondary | ICD-10-CM | POA: Diagnosis not present

## 2020-06-05 DIAGNOSIS — Z888 Allergy status to other drugs, medicaments and biological substances status: Secondary | ICD-10-CM

## 2020-06-05 DIAGNOSIS — Z9049 Acquired absence of other specified parts of digestive tract: Secondary | ICD-10-CM | POA: Diagnosis not present

## 2020-06-05 DIAGNOSIS — E785 Hyperlipidemia, unspecified: Secondary | ICD-10-CM | POA: Diagnosis not present

## 2020-06-05 DIAGNOSIS — F17210 Nicotine dependence, cigarettes, uncomplicated: Secondary | ICD-10-CM | POA: Diagnosis present

## 2020-06-05 DIAGNOSIS — I4891 Unspecified atrial fibrillation: Secondary | ICD-10-CM | POA: Diagnosis not present

## 2020-06-05 DIAGNOSIS — Z7951 Long term (current) use of inhaled steroids: Secondary | ICD-10-CM | POA: Diagnosis not present

## 2020-06-05 DIAGNOSIS — Z8371 Family history of colonic polyps: Secondary | ICD-10-CM | POA: Diagnosis not present

## 2020-06-05 DIAGNOSIS — Z79899 Other long term (current) drug therapy: Secondary | ICD-10-CM

## 2020-06-05 DIAGNOSIS — K589 Irritable bowel syndrome without diarrhea: Secondary | ICD-10-CM | POA: Diagnosis present

## 2020-06-05 DIAGNOSIS — Z20822 Contact with and (suspected) exposure to covid-19: Secondary | ICD-10-CM | POA: Diagnosis present

## 2020-06-05 DIAGNOSIS — E559 Vitamin D deficiency, unspecified: Secondary | ICD-10-CM | POA: Diagnosis present

## 2020-06-05 LAB — CBC WITH DIFFERENTIAL/PLATELET
Abs Immature Granulocytes: 0.04 10*3/uL (ref 0.00–0.07)
Basophils Absolute: 0.1 10*3/uL (ref 0.0–0.1)
Basophils Relative: 1 %
Eosinophils Absolute: 0.3 10*3/uL (ref 0.0–0.5)
Eosinophils Relative: 3 %
HCT: 48.7 % (ref 39.0–52.0)
Hemoglobin: 15.7 g/dL (ref 13.0–17.0)
Immature Granulocytes: 0 %
Lymphocytes Relative: 26 %
Lymphs Abs: 2.7 10*3/uL (ref 0.7–4.0)
MCH: 30.4 pg (ref 26.0–34.0)
MCHC: 32.2 g/dL (ref 30.0–36.0)
MCV: 94.4 fL (ref 80.0–100.0)
Monocytes Absolute: 0.7 10*3/uL (ref 0.1–1.0)
Monocytes Relative: 6 %
Neutro Abs: 6.4 10*3/uL (ref 1.7–7.7)
Neutrophils Relative %: 64 %
Platelets: 263 10*3/uL (ref 150–400)
RBC: 5.16 MIL/uL (ref 4.22–5.81)
RDW: 13.2 % (ref 11.5–15.5)
WBC: 10.2 10*3/uL (ref 4.0–10.5)
nRBC: 0 % (ref 0.0–0.2)

## 2020-06-05 LAB — COMPREHENSIVE METABOLIC PANEL
ALT: 27 U/L (ref 0–44)
AST: 22 U/L (ref 15–41)
Albumin: 4.2 g/dL (ref 3.5–5.0)
Alkaline Phosphatase: 66 U/L (ref 38–126)
Anion gap: 11 (ref 5–15)
BUN: 14 mg/dL (ref 6–20)
CO2: 23 mmol/L (ref 22–32)
Calcium: 9.3 mg/dL (ref 8.9–10.3)
Chloride: 106 mmol/L (ref 98–111)
Creatinine, Ser: 1.17 mg/dL (ref 0.61–1.24)
GFR, Estimated: >60 mL/min (ref >60)
Glucose, Bld: 116 mg/dL — ABNORMAL HIGH (ref 70–99)
Potassium: 3.8 mmol/L (ref 3.5–5.1)
Sodium: 140 mmol/L (ref 135–145)
Total Bilirubin: 0.5 mg/dL (ref 0.3–1.2)
Total Protein: 7.7 g/dL (ref 6.5–8.1)

## 2020-06-05 LAB — ECHOCARDIOGRAM COMPLETE
AR max vel: 4.11 cm2
AV Area VTI: 4.14 cm2
AV Area mean vel: 3.69 cm2
AV Mean grad: 1 mmHg
AV Peak grad: 1.7 mmHg
Ao pk vel: 0.66 m/s
Area-P 1/2: 4.06 cm2
Height: 69 in
MV VTI: 2.31 cm2
S' Lateral: 3.3 cm
Weight: 3200 oz

## 2020-06-05 LAB — RESP PANEL BY RT-PCR (FLU A&B, COVID) ARPGX2
Influenza A by PCR: NEGATIVE
Influenza B by PCR: NEGATIVE
SARS Coronavirus 2 by RT PCR: NEGATIVE

## 2020-06-05 LAB — TSH: TSH: 1.145 u[IU]/mL (ref 0.350–4.500)

## 2020-06-05 MED ORDER — SODIUM CHLORIDE 0.9 % IV SOLN: INTRAVENOUS | Status: DC

## 2020-06-05 MED ORDER — DILTIAZEM HCL 30 MG PO TABS: 30.0000 mg | ORAL_TABLET | ORAL | 0 refills | Status: DC | PRN

## 2020-06-05 MED ORDER — DILTIAZEM HCL-DEXTROSE 125-5 MG/125ML-% IV SOLN (PREMIX)
5.0000 mg/h | INTRAVENOUS | Status: DC
  Administered 2020-06-05: 10 mg/h via INTRAVENOUS
  Filled 2020-06-05: qty 125

## 2020-06-05 MED ORDER — DILTIAZEM LOAD VIA INFUSION
10.0000 mg | Freq: Once | INTRAVENOUS | Status: AC
  Administered 2020-06-05: 10 mg via INTRAVENOUS
  Filled 2020-06-05: qty 10

## 2020-06-05 MED ORDER — DILTIAZEM HCL 30 MG PO TABS
30.0000 mg | ORAL_TABLET | Freq: Once | ORAL | Status: AC
  Administered 2020-06-05: 30 mg via ORAL
  Filled 2020-06-05: qty 1

## 2020-06-05 NOTE — ED Provider Notes (Addendum)
[facility] EMERGENCY DEPARTMENT Provider Note   CSN: [id number] Arrival date & time: 06/05/20  1141     History Chief Complaint  Patient presents with  . Tachycardia    Robert Gordon is a 55 y.o. male.  Patient with onset of rapid heart rate this morning at about 8.  Coffee around 730.  Patient was also seen March 14 when he was sent in from urgent care for rapid heart rate.  But he had converted here and then was observed and there was no further arrhythmias.  So patient was discharged home with follow-up with cardiology.  Was not started on medications.  Patient is never had a history of this in the past.  Not associated with any chest pain associated with leg swelling.  Patient does have some shortness of breath but he thought it was more similar to his normal asthma.  Patient states that since it started going fast this morning it has been waxing and waning sometimes slower rate other times fast.  Cardiac monitoring here consistent with rapid heart rate around 160 irregular rate.  But then sometimes will spontaneously convert and now go down to below 100.  But then is back into a faster rate.        Past Medical History:  Diagnosis Date  . Arthritis   . Asthma   . Avascular necrosis of bones of both hips (HCC)   . Diverticulitis    s/p sigmoid colectomy  . GERD (gastroesophageal reflux disease)   . Hyperlipidemia   . IBS (irritable bowel syndrome)     Patient Active Problem List   Diagnosis Date Noted  . LLQ pain 09/25/2012  . Unspecified constipation 09/25/2012  . ALLERGIC RHINITIS 11/28/2008  . INTRINSIC ASTHMA, UNSPECIFIED 11/28/2008  . GERD 11/28/2008  . DIVERTICULITIS, COLON 11/28/2008    Past Surgical History:  Procedure Laterality Date  . COLONOSCOPY N/A 10/02/2012   Procedure: COLONOSCOPY;  Surgeon: West BaliSandi L Fields, MD;  Location: [facility];  Service: Endoscopy;  Laterality: N/A;  11:00  . HIP SURGERY Left   . LAPAROSCOPIC SIGMOID COLECTOMY   09/11/2006  . SIGMOIDOSCOPY  08/14/2006   [id number]:[id number]:Multiple diverticula seen beginning 20 cm from the anal verge and extending to 50 cm from the anal verge/Normal retroflexed view of the rectum.  . TONSILLECTOMY         Family History  Problem Relation Age of Onset  . Colon polyps Mother        pre-cancerous    Social History   Tobacco Use  . Smoking status: Current Some Day Smoker    Packs/day: 0.50    Types: Cigarettes  . Smokeless tobacco: Never Used  Substance Use Topics  . Alcohol use: No  . Drug use: No    Home Medications Prior to Admission medications   Medication Sig Start Date End Date Taking? Authorizing Provider  albuterol (PROVENTIL HFA;VENTOLIN HFA) 108 (90 BASE) MCG/ACT inhaler Inhale 2 puffs into the lungs every 4 (four) hours as needed for wheezing. 01/16/11 09/05/12  Rancour, Jeannett SeniorStephen, MD  albuterol (PROVENTIL) (2.5 MG/3ML) 0.083% nebulizer solution Take 2.5 mg by nebulization every 4 (four) hours as needed. For shortness of breath    [provider]  atorvastatin (LIPITOR) 40 MG tablet Take 20-40 mg by mouth at bedtime.    [provider]  budesonide-formoterol (SYMBICORT) 160-4.5 MCG/ACT inhaler Inhale 2 puffs into the lungs 2 (two) times daily. Pt currently out of medication but normally takes the medication everyday  [provider]  cyclobenzaprine (FLEXERIL) 5 MG tablet Take 5 mg by mouth 3 (three) times daily as needed for muscle spasms.    [provider]  ipratropium (ATROVENT) 0.02 % nebulizer solution Take 500 mcg by nebulization every 4 (four) hours as needed. For shortness of breath    [provider]  Linaclotide (LINZESS) 145 MCG CAPS 1 PO 30 mins prior to your first meal Patient not taking: Reported on 05/25/2020 10/02/12   West Bali, MD  omeprazole (PRILOSEC) 20 MG capsule Take 1 capsule (20 mg total) by mouth daily. Patient not taking: Reported on 05/25/2020 09/25/12   Gelene Mink, NP    Allergies     Dextromethorphan, Dm [dextromethorphan-guaifenesin], and Lactaid [lactase]  Review of Systems   Review of Systems  Constitutional: Negative for chills and fever.  HENT: Negative for rhinorrhea and sore throat.   Eyes: Negative for visual disturbance.  Respiratory: Positive for shortness of breath. Negative for cough.   Cardiovascular: Positive for palpitations. Negative for chest pain and leg swelling.  Gastrointestinal: Negative for abdominal pain, diarrhea, nausea and vomiting.  Genitourinary: Negative for dysuria.  Musculoskeletal: Negative for back pain and neck pain.  Skin: Negative for rash.  Neurological: Negative for dizziness, light-headedness and headaches.  Hematological: Does not bruise/bleed easily.  Psychiatric/Behavioral: Negative for confusion.    Physical Exam Updated Vital Signs BP 107/70   Pulse (!) 181   Resp 19   Ht 1.753 m (5\' 9" )   Wt 90.7 kg   SpO2 100%   BMI 29.53 kg/m   Physical Exam Vitals and nursing note reviewed.  Constitutional:      General: He is not in acute distress.    Appearance: Normal appearance. He is well-developed.  HENT:     Head: Normocephalic and atraumatic.  Eyes:     Extraocular Movements: Extraocular movements intact.     Conjunctiva/sclera: Conjunctivae normal.     Pupils: Pupils are equal, round, and reactive to light.  Cardiovascular:     Rate and Rhythm: Tachycardia present. Rhythm irregular.     Heart sounds: No murmur heard.   Pulmonary:     Effort: Pulmonary effort is normal. No respiratory distress.     Breath sounds: Normal breath sounds.  Abdominal:     Palpations: Abdomen is soft.     Tenderness: There is no abdominal tenderness.  Musculoskeletal:        General: No swelling.     Cervical back: Neck supple.  Skin:    General: Skin is warm and dry.     Capillary Refill: Capillary refill takes less than 2 seconds.  Neurological:     General: No focal deficit present.     Mental Status: He is alert  and oriented to person, place, and time.     Cranial Nerves: No cranial nerve deficit.     Sensory: No sensory deficit.     Motor: No weakness.     ED Results / Procedures / Treatments   Labs (all labs ordered are listed, but only abnormal results are displayed) Labs Reviewed  RESP PANEL BY RT-PCR (FLU A&B, COVID) [id number]  COMPREHENSIVE METABOLIC PANEL  CBC WITH DIFFERENTIAL/PLATELET    EKG EKG Interpretation  Date/Time:  Friday June 05 2020 11:55:46 EDT Ventricular Rate:  164 PR Interval:    QRS Duration: 81 QT Interval:  266 QTC Calculation: 440 R Axis:   101 Text Interpretation: Atrial fibrillation with rapid V-rate Right axis deviation Nonspecific T abnormalities,  lateral leads Baseline wander in lead(s) V2 Confirmed by Vanetta Mulders [phone]) on 06/05/2020 12:22:18 PM   Radiology No results found.  Procedures Procedures   CRITICAL CARE Performed by: Vanetta Mulders Total critical care time: 45 minutes Critical care time was exclusive of separately billable procedures and treating other patients. Critical care was necessary to treat or prevent imminent or life-threatening deterioration. Critical care was time spent personally by me on the following activities: development of treatment plan with patient and/or surrogate as well as nursing, discussions with consultants, evaluation of patient's response to treatment, examination of patient, obtaining history from patient or surrogate, ordering and performing treatments and interventions, ordering and review of laboratory studies, ordering and review of radiographic studies, pulse oximetry and re-evaluation of patient's condition.   Medications Ordered in ED Medications  0.9 %  sodium chloride infusion (has no administration in time range)  diltiazem (CARDIZEM) 1 mg/mL load via infusion 10 mg (has no administration in time range)    And  diltiazem (CARDIZEM) 125 mg in dextrose 5% 125 mL (1 mg/mL) infusion (has no  administration in time range)    ED Course  I have reviewed the triage vital signs and the nursing notes.  Pertinent labs & imaging results that were available during my care of the patient were reviewed by me and considered in my medical decision making (see chart for details).    MDM Rules/Calculators/A&P                           Patient seen by me on May 26, 2018 was sent from urgent care.  Patient had spontaneous converted shortly after arrival here.  And then was observed and had no further palpitations or rapid heart rate.  Patient was slotted to follow-up with cardiology which she has made an appointment but is not for a few weeks till now.  Patient states he had some episodes of the rapid heart rate in between the 14th and today.  But today at about 8:00 in the morning it started and has been waxing and waning.  Sometimes going very fast other times slower.  Associated with some slight shortness of breath no chest pain.  Patient does have a history of asthma.  So he did not think the shortness of breath was anything out of the ordinary.   No prior history of similar symptoms.  Patient will be started here on Cardizem get a 10 mg bolus and then a drip.  And will admit.  Feel patient is not a candidate for cardioversion because he had some of this going on on the 14th and has had some between now and then and ongoing again today.  Patient is not currently on blood thinners  This will be new onset atrial fibrillation with RVR and patient will need admission this time.    Final Clinical Impression(s) / ED Diagnoses Final diagnoses:  Atrial fibrillation with RVR Robert Gordon)    Rx / DC Orders ED Discharge Orders    None       Vanetta Mulders, MD 06/05/20 1248   Patient was admitted by hospitalist.  And seen by cardiology.  Patient converted while on the drip.  They stopped the drip and seems to be staying rate controlled.  So cardiology setting go home with Cardizem 30 mg as  needed.  They will see him in the cardiology office on March 29.  Patient will return for any recurrent and persistent fast  heart rate.  Patient also receiving his echocardiogram here in the emergency department at this time.    Vanetta Mulders, MD 06/05/20 1444

## 2020-06-05 NOTE — Discharge Instructions (Addendum)
You have been cleared by cardiology for discharge home.  Prescription provided for cardia exam to be picked up at your pharmacy.  30 mg as needed.  Cardiology instructed to take 30 mg if in an hour heart rate has not improved then take a second 30 mg.  If after that it does not improve then seek medical attention.  Follow-up with cardiology on March 29.

## 2020-06-05 NOTE — Consult Note (Addendum)
Medical Consultation   Robert Gordon  FIE:[id number]  DOB: [birth year]  DOA: 06/05/2020  [facility]   Outpatient Specialists: None  Requesting physician: Dr. Deretha Emory  Reason for consultation: Afib with RVR  History of Present Illness: Robert Gordon is an 55 y.o. male with history of asthma, GERD, dyslipidemia, and vitamin D deficiency who presented to the ED with concern for palpitations this morning.  He did previously present to the ED on 3/14 for atrial fibrillation converted to normal sinus rhythm spontaneously.  He was not started on any medications back then.  He also has some associated shortness of breath.  He denies any chest pain and states that his symptoms began around 8 AM after having some caffeine.  He notes a correlation between drinking and recurrence of palpitations.  He was started on Cardizem drip in the ED and now has converted to normal sinus rhythm.  Review of Systems:  ROS As per HPI otherwise 10 point review of systems negative.   Past Medical History: Past Medical History:  Diagnosis Date  . Arthritis   . Asthma   . Avascular necrosis of bones of both hips (HCC)   . Diverticulitis    s/p sigmoid colectomy  . GERD (gastroesophageal reflux disease)   . Hyperlipidemia   . IBS (irritable bowel syndrome)     Past Surgical History: Past Surgical History:  Procedure Laterality Date  . COLONOSCOPY N/A 10/02/2012   Procedure: COLONOSCOPY;  Surgeon: West Bali, MD;  [facility];  Service: Endoscopy;  Laterality: N/A;  11:00  . HIP SURGERY Left   . LAPAROSCOPIC SIGMOID COLECTOMY  09/11/2006  . SIGMOIDOSCOPY  08/14/2006   ZYS:[id number] diverticula seen beginning 20 cm from the anal verge and extending to 50 cm from the anal verge/Normal retroflexed view of the rectum.  . TONSILLECTOMY     Allergies:   Allergies  Allergen Reactions  . Dextromethorphan Other (See Comments)    Thinks it may be a  causative agent in the asthma attacks, unsure. Had an attack one time after taking cough medicine containing dextromethorphan.   Gust Rung [Dextromethorphan-Guaifenesin] Other (See Comments)    Hurts chest.  . Lactaid [Lactase] Diarrhea, Nausea And Vomiting and Other (See Comments)    Abdominal cramping/bloating     Social History:  reports that he has been smoking cigarettes. He has been smoking about 0.50 packs per day. He has never used smokeless tobacco. He reports that he does not drink alcohol and does not use drugs.   Family History: Family History  Problem Relation Age of Onset  . Colon polyps Mother        pre-cancerous    Physical Exam: Vitals:   06/05/20 1230 06/05/20 1245 06/05/20 1300 06/05/20 1315  BP:    102/72  Pulse: (!) 106 79 (!) 148 (!) 105  Resp: 17 (!) 23 (!) 23 14  SpO2: 95% 96% 94% 98%  Weight:      Height:        Constitutional:  Alert and awake, oriented x3, not in any acute distress. Eyes: PERLA, EOMI, irises appear normal, anicteric sclera,  ENMT: external ears and nose appear normal            Lips appears normal, oropharynx mucosa, tongue, posterior pharynx appear normal  Neck: neck appears normal, no masses, normal ROM, no thyromegaly, no JVD  CVS: S1-S2 clear,  no murmur rubs or gallops, no LE edema, normal pedal pulses  Respiratory:  clear to auscultation bilaterally, no wheezing, rales or rhonchi. Respiratory effort normal. No accessory muscle use.  Abdomen: soft nontender, nondistended, normal bowel sounds, no hepatosplenomegaly, no hernias  Musculoskeletal: : no cyanosis, clubbing or edema noted bilaterally Psych: judgement and insight appear normal, stable mood and affect, mental status Skin: no rashes or lesions or ulcers, no induration or nodules   Data reviewed:  I have personally reviewed following labs and imaging studies Labs:  CBC: Recent Labs  Lab 06/05/20 1235  WBC 10.2  NEUTROABS 6.4  HGB 15.7  HCT 48.7  MCV 94.4  PLT 263     Basic Metabolic Panel: Recent Labs  Lab 06/05/20 1235  NA 140  K 3.8  CL 106  CO2 23  GLUCOSE 116*  BUN 14  CREATININE 1.17  CALCIUM 9.3   GFR Estimated Creatinine Clearance: 81.3 mL/min (by C-G formula based on SCr of 1.17 mg/dL). Liver Function Tests: Recent Labs  Lab 06/05/20 1235  AST 22  ALT 27  ALKPHOS 66  BILITOT 0.5  PROT 7.7  ALBUMIN 4.2   No results for input(s): LIPASE, AMYLASE in the last 168 hours. No results for input(s): AMMONIA in the last 168 hours. Coagulation profile No results for input(s): INR, PROTIME in the last 168 hours.  Cardiac Enzymes: No results for input(s): CKTOTAL, CKMB, CKMBINDEX, TROPONINI in the last 168 hours. BNP: Invalid input(s): POCBNP CBG: No results for input(s): GLUCAP in the last 168 hours. D-Dimer No results for input(s): DDIMER in the last 72 hours. Hgb A1c No results for input(s): HGBA1C in the last 72 hours. Lipid Profile No results for input(s): CHOL, HDL, LDLCALC, TRIG, CHOLHDL, LDLDIRECT in the last 72 hours. Thyroid function studies No results for input(s): TSH, T4TOTAL, T3FREE, THYROIDAB in the last 72 hours.  Invalid input(s): FREET3 Anemia work up No results for input(s): VITAMINB12, FOLATE, FERRITIN, TIBC, IRON, RETICCTPCT in the last 72 hours. Urinalysis    Component Value Date/Time   COLORURINE YELLOW 11/30/2009 1432   APPEARANCEUR CLEAR 11/30/2009 1432   LABSPEC >1.030 (H) 11/30/2009 1432   PHURINE 5.5 11/30/2009 1432   GLUCOSEU NEGATIVE 11/30/2009 1432   HGBUR NEGATIVE 11/30/2009 1432   BILIRUBINUR NEGATIVE 11/30/2009 1432   KETONESUR TRACE (A) 11/30/2009 1432   PROTEINUR NEGATIVE 11/30/2009 1432   UROBILINOGEN 0.2 11/30/2009 1432   NITRITE NEGATIVE 11/30/2009 1432   LEUKOCYTESUR  11/30/2009 1432    NEGATIVE MICROSCOPIC NOT DONE ON URINES WITH NEGATIVE PROTEIN, BLOOD, LEUKOCYTES, NITRITE, OR GLUCOSE <1000 mg/dL.     Sepsis Labs Invalid input(s): PROCALCITONIN,  WBC,   LACTICIDVEN Microbiology No results found for this or any previous visit (from the past 240 hour(s)).     Inpatient Medications:   Scheduled Meds: . diltiazem  30 mg Oral Once   Radiological Exams on Admission: DG Chest Port 1 View  Result Date: 06/05/2020 CLINICAL DATA:  Palpitations. EXAM: PORTABLE CHEST 1 VIEW COMPARISON:  Chest x-ray dated May 25, 2020. FINDINGS: The heart size and mediastinal contours are within normal limits. Both lungs are clear. The visualized skeletal structures are unremarkable. IMPRESSION: No active disease. Electronically Signed   By: Obie Dredge M.D.   On: 06/05/2020 13:14    Impression/Recommendations Active Problems:   Atrial fibrillation with RVR (HCC)   New onset atrial fibrillation with RVR -Plan to start Cardizem drip as he is converted to normal sinus rhythm and is no longer symptomatic -Give 1  dose of Cardizem now -Plan to discharged on Cardizem 30 mg as needed for palpitations -Follow-up with cardiology Dr. Diona Browner as scheduled on 3/29 at 2:20 PM -Can have further imaging such as 2D echocardiogram in outpatient setting -ChadsVasc is 0 and patient does not require anticoagulation -Appreciate cardiology evaluation  Thank you for this consultation.   Time Spent: 35 minutes  Erick Blinks M.D. [facility] 06/05/2020, 2:12 PM

## 2020-06-05 NOTE — ED Triage Notes (Signed)
Pt c/o of palpitation, sob and dizziness x 1 month. Was at work today, and felt his heart "racing"

## 2020-06-05 NOTE — ED Notes (Signed)
Pt appears to be in sinus rhythm at 88.

## 2020-06-05 NOTE — Progress Notes (Signed)
  Echocardiogram 2D Echocardiogram has been performed.  Gerda Diss 06/05/2020, 3:33 PM

## 2020-06-05 NOTE — Consult Note (Signed)
Cardiology Consultation:   Patient ID: Robert Gordon MRN: [id number]; DOB: [birth year]  Admit date: 06/05/2020 Date of Consult: 06/05/2020  [facility]P:  [facility]   [facility]  Cardiologist:  No primary care provider on file.  Advanced Practice Provider:  No care team member to display Electrophysiologist:  None [id number]   Chief complaint: Palpitations  History of Present Illness:   Mr. Robert Gordon is a 55 year old gentleman with a PMHx significant for arthritis, asthma, GERD, IBS and hyperlipidemia, who was admitted with complaints of palpitations starting in the morning today. He was having some associated shortness of breath. Denies any chest pain. Symptoms started at around 8 am after his morning cup of coffee. Palpitations have been waxing and waning since then. In the ED the initial HR was in the 160s-180s. He was placed on IV cardizem infusion (after a 10mg  IV bolus). He converted back to sinus rhythm by the time I saw him in the ED.  Previous presentation on 05/25/20 for AFIB and at that time as well he converted to NSR spontaneuously. Was not started on any AV nodal blockers back then.  ECG from 06/05/2020 at 11:55 am (that I reviewed personally) showed atrial fibrillation with RVR, rate 164 bpm, and non-specific T wave abnormalities. Initial vitals in the ED: BP 107/70 mmHg, HR 181 bpm. CXR was unremarkable.   His labs were: K 3.8, Gluc 116, Cr 1.17, WBC 10.2, Hct 15.7 and Platelets 263.     Past Medical History:  Diagnosis Date  . Arthritis   . Asthma   . Avascular necrosis of bones of both hips (HCC)   . Diverticulitis    s/p sigmoid colectomy  . GERD (gastroesophageal reflux disease)   . Hyperlipidemia   . IBS (irritable bowel syndrome)     Past Surgical History:  Procedure Laterality Date  . COLONOSCOPY N/A 10/02/2012   Procedure: COLONOSCOPY;  Surgeon: 10/04/2012, MD;  [facility];  Service: Endoscopy;  Laterality:  N/A;  11:00  . HIP SURGERY Left   . LAPAROSCOPIC SIGMOID COLECTOMY  09/11/2006  . SIGMOIDOSCOPY  08/14/2006   10/14/2006 diverticula seen beginning 20 cm from the anal verge and extending to 50 cm from the anal verge/Normal retroflexed view of the rectum.  . TONSILLECTOMY       Home Medications:  Prior to Admission medications   Medication Sig Start Date End Date Taking? Authorizing Provider  albuterol (PROVENTIL HFA;VENTOLIN HFA) 108 (90 BASE) MCG/ACT inhaler Inhale 2 puffs into the lungs every 4 (four) hours as needed for wheezing. 01/16/11 09/05/12  Rancour, 09/07/12, MD  albuterol (PROVENTIL) (2.5 MG/3ML) 0.083% nebulizer solution Take 2.5 mg by nebulization every 4 (four) hours as needed. For shortness of breath    [provider]  atorvastatin (LIPITOR) 40 MG tablet Take 20-40 mg by mouth at bedtime.    [provider]  budesonide-formoterol (SYMBICORT) 160-4.5 MCG/ACT inhaler Inhale 2 puffs into the lungs 2 (two) times daily. Pt currently out of medication but normally takes the medication everyday    [provider]  cyclobenzaprine (FLEXERIL) 5 MG tablet Take 5 mg by mouth 3 (three) times daily as needed for muscle spasms.    [provider]  ipratropium (ATROVENT) 0.02 % nebulizer solution Take 500 mcg by nebulization every 4 (four) hours as needed. For shortness of breath    [provider]  Linaclotide (LINZESS) 145 MCG CAPS 1 PO 30 mins prior to your first  meal Patient not taking: Reported on 05/25/2020 10/02/12   West Bali, MD  omeprazole (PRILOSEC) 20 MG capsule Take 1 capsule (20 mg total) by mouth daily. Patient not taking: Reported on 05/25/2020 09/25/12   Gelene Mink, NP    Inpatient Medications: Scheduled Meds:  Continuous Infusions: . sodium chloride 75 mL/hr at 06/05/20 1313  . diltiazem (CARDIZEM) infusion 15 mg/hr (06/05/20 1326)   PRN Meds:   Allergies:    Allergies  Allergen Reactions  . Dextromethorphan  Other (See Comments)    Thinks it may be a causative agent in the asthma attacks, unsure. Had an attack one time after taking cough medicine containing dextromethorphan.   Gust Rung [Dextromethorphan-Guaifenesin] Other (See Comments)    Hurts chest.  . Lactaid [Lactase] Diarrhea, Nausea And Vomiting and Other (See Comments)    Abdominal cramping/bloating    Social History:   Social History   Socioeconomic History  . Marital status: Married    Spouse name: Not on file  . Number of children: Not on file  . Years of education: Not on file  . Highest education level: Not on file  Occupational History  . Occupation: Transport planner: [employer]  Tobacco Use  . Smoking status: Current Some Day Smoker    Packs/day: 0.50    Types: Cigarettes  . Smokeless tobacco: Never Used  Substance and Sexual Activity  . Alcohol use: No  . Drug use: No  . Sexual activity: Not on file  Other Topics Concern  . Not on file  Social History Narrative  . Not on file   Social Determinants of Health   Financial Resource Strain: Not on file  Food Insecurity: Not on file  Transportation Needs: Not on file  Physical Activity: Not on file  Stress: Not on file  Social Connections: Not on file  Intimate Partner Violence: Not on file    Family History:    Family History  Problem Relation Age of Onset  . Colon polyps Mother        pre-cancerous     ROS:  Please see the history of present illness.  All other ROS reviewed and negative.     Physical Exam/Data:   Vitals:   06/05/20 1230 06/05/20 1245 06/05/20 1300 06/05/20 1315  BP:    102/72  Pulse: (!) 106 79 (!) 148 (!) 105  Resp: 17 (!) 23 (!) 23 14  SpO2: 95% 96% 94% 98%  Weight:      Height:       No intake or output data in the 24 hours ending 06/05/20 1359 Last 3 Weights 06/05/2020 05/25/2020 07/27/2018  Weight (lbs) 200 lb 200 lb 210 lb  Weight (kg) 90.719 kg 90.719 kg 95.255 kg     Body mass index  is 29.53 kg/m.  General:  Well nourished, well developed, in no acute distress HEENT: normal Lymph: no adenopathy Neck: no JVD Endocrine:  No thryomegaly Vascular: No carotid bruits; FA pulses 2+ bilaterally without bruits  Cardiac:  Regular rate and rhythm, Normal S1 S2, no murmurs Lungs:  clear to auscultation bilaterally, no wheezing, rhonchi or rales  Abd: soft, nontender, no hepatomegaly  Ext: no edema Musculoskeletal:  No deformities, BUE and BLE strength normal and equal Skin: warm and dry  Neuro:  CNs 2-12 intact, no focal abnormalities noted Psych:  Normal affect   EKG:  The EKG was personally reviewed and demonstrates: Atrial fibrillation with RVR, rate 164 bpm,  and non-specific T wave abnormalities.  Telemetry:  Telemetry was personally reviewed and demonstrates:  Sinus rhythm, rate 80s   Laboratory Data:  High Sensitivity Troponin:   Recent Labs  Lab 05/25/20 1227 05/25/20 1408  TROPONINIHS 4 4     Chemistry Recent Labs  Lab 06/05/20 1235  NA 140  K 3.8  CL 106  CO2 23  GLUCOSE 116*  BUN 14  CREATININE 1.17  CALCIUM 9.3  GFRNONAA >60  ANIONGAP 11    Recent Labs  Lab 06/05/20 1235  PROT 7.7  ALBUMIN 4.2  AST 22  ALT 27  ALKPHOS 66  BILITOT 0.5   Hematology Recent Labs  Lab 06/05/20 1235  WBC 10.2  RBC 5.16  HGB 15.7  HCT 48.7  MCV 94.4  MCH 30.4  MCHC 32.2  RDW 13.2  PLT 263   BNPNo results for input(s): BNP, PROBNP in the last 168 hours.  DDimer No results for input(s): DDIMER in the last 168 hours.   Radiology/Studies:  DG Chest Port 1 View  Result Date: 06/05/2020 CLINICAL DATA:  Palpitations. EXAM: PORTABLE CHEST 1 VIEW COMPARISON:  Chest x-ray dated May 25, 2020. FINDINGS: The heart size and mediastinal contours are within normal limits. Both lungs are clear. The visualized skeletal structures are unremarkable. IMPRESSION: No active disease. Electronically Signed   By: Obie Dredge M.D.   On: 06/05/2020 13:14      Assessment and Plan:   1.  Atrial fibrillation with rapid ventricular rate  The patient has converted back to normal sinus rhythm and the IV cardizem has been discontinued. He is symptomatically much improved.   The CHA2DS2-VASc Score = 0 and chronic anticoagulation is not indicated  -Cardizem 30 mg PO as needed to treat episodes of tachycardia in the future (as an outpatient) -He will follow-up in our cardiology clinic (already has appointment scheduled) - will likely need an echo and heart monitor study -He is encouraged to cut down on caffeine intake  Patient can be safely discharged from the ED after appropriate observation.   Risk Assessment/Risk Scores:   CHA2DS2-VASc Score = 0  This indicates a 0.2% annual risk of stroke. The patient's score is based upon: CHF History: No HTN History: No Diabetes History: No Stroke History: No Vascular Disease History: No Age Score: 0 Gender Score: 0     For questions or updates, please contact [facility] Please consult [email or web address] for contact info under    Signed, Lonie Peak, MD  06/05/2020 1:59 PM

## 2020-06-08 ENCOUNTER — Encounter: Payer: Self-pay | Admitting: Cardiology

## 2020-06-08 NOTE — Progress Notes (Signed)
Cardiology Office Note  Date: 06/09/2020   ID: Robert Gordon, DOB July 11, 1965, MRN [id number]  PCP:  [facility]  Cardiologist:  Robert Dell, MD Electrophysiologist:  None   Chief Complaint  Patient presents with  . Follow-up atrial fibrillation    History of Present Illness: Robert Gordon is a 55 y.o. male presenting for a post hospital visit, this is our first meeting today.  I reviewed his records and updated the chart.  He was seen in consultation by Dr. Deforest Gordon on March 25 with newly documented paroxysmal atrial fibrillation with RVR.  He was discharged on short acting Cardizem 30 mg to be used as needed for breakthrough palpitations, CHA2DS2-VASc score of 0.  He is here today with his wife for a follow-up visit.  He reports using 2 separate doses of short acting Cardizem due to palpitations since last evaluated.  We discussed caffeine reduction and also smoking cessation today.  I personally reviewed his ECG from March 25, consistent with atrial fibrillation/atypical flutter with RVR.  Echocardiogram from March 25 revealed LVEF 55 to 60% with normal diastolic parameters, no significant valvular abnormalities, aortic root 38 mm.  Past Medical History:  Diagnosis Date  . Arthritis   . Asthma   . Avascular necrosis of bones of both hips (HCC)   . Diverticulitis    s/p sigmoid colectomy  . GERD (gastroesophageal reflux disease)   . Hyperlipidemia   . IBS (irritable bowel syndrome)   . Paroxysmal atrial fibrillation [facility])    Diagnosed March 2022    Past Surgical History:  Procedure Laterality Date  . COLONOSCOPY N/A 10/02/2012   Procedure: COLONOSCOPY;  Surgeon: West Bali, MD;  [facility];  Service: Endoscopy;  Laterality: N/A;  11:00  . HIP SURGERY Left   . LAPAROSCOPIC SIGMOID COLECTOMY  09/11/2006  . SIGMOIDOSCOPY  08/14/2006   ERD:[id number] diverticula seen beginning 20 cm from the anal verge and extending to 50 cm from the anal  verge/Normal retroflexed view of the rectum.  . TONSILLECTOMY      Current Outpatient Medications  Medication Sig Dispense Refill  . albuterol (PROVENTIL) (2.5 MG/3ML) 0.083% nebulizer solution Take 2.5 mg by nebulization every 4 (four) hours as needed for shortness of breath. For shortness of breath    . albuterol (VENTOLIN HFA) 108 (90 Base) MCG/ACT inhaler Inhale 1 puff into the lungs every 6 (six) hours as needed for wheezing or shortness of breath.    Marland Kitchen atorvastatin (LIPITOR) 40 MG tablet Take 40 mg by mouth at bedtime.    . budesonide-formoterol (SYMBICORT) 160-4.5 MCG/ACT inhaler Inhale 2 puffs into the lungs 2 (two) times daily. Pt currently out of medication but normally takes the medication everyday    . Cholecalciferol 50 MCG (2000 UT) TABS Take 1 tablet by mouth daily.    Marland Kitchen diltiazem (CARDIZEM CD) 120 MG 24 hr capsule Take 1 capsule (120 mg total) by mouth daily. 90 capsule 3  . diltiazem (CARDIZEM) 30 MG tablet Take 1 tablet (30 mg total) by mouth as needed. For palpitations.  Take 1 tablet if palpitations do not stop and 1 hour.  Take second tablet.  If they do not stop after that seek medical attention. 20 tablet 0  . ipratropium (ATROVENT) 0.02 % nebulizer solution Take 500 mcg by nebulization every 4 (four) hours as needed. For shortness of breath    . Omega-3 Fatty Acids (FISH OIL) 1000 MG CAPS Take 1 capsule by mouth daily.  No current facility-administered medications for this visit.   Allergies:  Dextromethorphan, Dm [dextromethorphan-guaifenesin], and Lactaid [lactase]   ROS: No recent syncope or chest pain.  Physical Exam: VS:  BP 110/80   Pulse 85   Ht 5\' 9"  (1.753 m)   Wt 204 lb (92.5 kg)   SpO2 99%   BMI 30.13 kg/m , BMI Body mass index is 30.13 kg/m.  Wt Readings from Last 3 Encounters:  06/09/20 204 lb (92.5 kg)  06/05/20 200 lb (90.7 kg)  05/25/20 200 lb (90.7 kg)    General: Patient appears comfortable at rest. HEENT: Conjunctiva and lids normal,  wearing a mask. Neck: Supple, no elevated JVP or carotid bruits, no thyromegaly. Lungs: Clear to auscultation, nonlabored breathing at rest. Cardiac: Regular rate and rhythm, no S3 or significant systolic murmur, no pericardial rub. Extremities: No pitting edema.  ECG:  An ECG dated 05/25/2020 was personally reviewed today and demonstrated:  Sinus tachycardia.  Recent Labwork: 06/05/2020: ALT 27; AST 22; BUN 14; Creatinine, Ser 1.17; Hemoglobin 15.7; Platelets 263; Potassium 3.8; Sodium 140; TSH 1.145   Other Studies Reviewed Today:  Echocardiogram 06/05/2020: 1. Left ventricular ejection fraction, by estimation, is 55 to 60%. The  left ventricle has normal function. The left ventricle has no regional  wall motion abnormalities. Left ventricular diastolic parameters were  normal.  2. Right ventricular systolic function is normal. The right ventricular  size is normal.  3. The mitral valve is normal in structure. Trivial mitral valve  regurgitation.  4. The aortic valve is normal in structure. Aortic valve regurgitation is  not visualized.  5. Aortic dilatation noted. There is mild dilatation of the aortic root,  measuring 38 mm.  6. The inferior vena cava is normal in size with greater than 50%  respiratory variability, suggesting right atrial pressure of 3 mmHg.   Assessment and Plan:  1.  Paroxysmal atrial fibrillation/atypical atrial flutter with CHA2DS2-VASc score of 0.  Still reports intermittent palpitations.  LVEF normal by echocardiogram.  Left atrial chamber size normal.  Recent TSH normal.  Recommend starting Cardizem CD 120 mg daily, can still use short acting Cardizem for breakthrough palpitations. If symptom frequency remains an issue, referral to EP for discussion of antiarrhythmics and/or ablation can be considered.  2.  Mixed hyperlipidemia, on Lipitor.  Medication Adjustments/Labs and Tests Ordered: Current medicines are reviewed at length with the patient  today.  Concerns regarding medicines are outlined above.   Tests Ordered: No orders of the defined types were placed in this encounter.   Medication Changes: Meds ordered this encounter  Medications  . diltiazem (CARDIZEM CD) 120 MG 24 hr capsule    Sig: Take 1 capsule (120 mg total) by mouth daily.    Dispense:  90 capsule    Refill:  3    Disposition:  Follow up 3 months in the [place] office.  Signed, [place], MD, [facility] 06/09/2020 3:09 PM    [facility] [address], [place], [place] Phone: [phone]; Fax: [phone]

## 2020-06-09 ENCOUNTER — Encounter: Payer: Self-pay | Admitting: Cardiology

## 2020-06-09 ENCOUNTER — Ambulatory Visit (INDEPENDENT_AMBULATORY_CARE_PROVIDER_SITE_OTHER): Payer: No Typology Code available for payment source | Admitting: Cardiology

## 2020-06-09 ENCOUNTER — Other Ambulatory Visit: Payer: Self-pay

## 2020-06-09 VITALS — BP 110/80 | HR 85 | Ht 69.0 in | Wt 204.0 lb

## 2020-06-09 DIAGNOSIS — I480 Paroxysmal atrial fibrillation: Secondary | ICD-10-CM | POA: Diagnosis not present

## 2020-06-09 DIAGNOSIS — E782 Mixed hyperlipidemia: Secondary | ICD-10-CM | POA: Diagnosis not present

## 2020-06-09 MED ORDER — DILTIAZEM HCL ER COATED BEADS 120 MG PO CP24: 120.0000 mg | ORAL_CAPSULE | Freq: Every day | ORAL | 3 refills | Status: DC

## 2020-06-09 NOTE — Patient Instructions (Signed)
Medication Instructions:  START Cardizem CD 120 mg daily    *If you need a refill on your cardiac medications before your next appointment, please call your pharmacy*   Lab Work: None today If you have labs (blood work) drawn today and your tests are completely normal, you will receive your results only by: Marland Kitchen MyChart Message (if you have MyChart) OR . A paper copy in the mail If you have any lab test that is abnormal or we need to change your treatment, we will call you to review the results.   Testing/Procedures: None today   Follow-Up: At [facility], you and your health needs are our priority.  As part of our continuing mission to provide you with exceptional heart care, we have created designated Provider Care Teams.  These Care Teams include your primary Cardiologist (physician) and Advanced Practice Providers (APPs -  Physician Assistants and Nurse Practitioners) who all work together to provide you with the care you need, when you need it.  We recommend signing up for the patient portal called "MyChart".  Sign up information is provided on this After Visit Summary.  MyChart is used to connect with patients for Virtual Visits (Telemedicine).  Patients are able to view lab/test results, encounter notes, upcoming appointments, etc.  Non-urgent messages can be sent to your provider as well.   To learn more about what you can do with MyChart, go to [email or web address].    Your next appointment:   3 month(s)  The format for your next appointment:   In Person  Provider:   Nona Dell, MD   Other Instructions None       Thank you for choosing [facility] !

## 2020-06-21 ENCOUNTER — Emergency Department (HOSPITAL_COMMUNITY)
Admission: EM | Admit: 2020-06-21 | Discharge: 2020-06-21 | Disposition: A | Discharge status: Home / Self Care | Payer: No Typology Code available for payment source | Attending: Emergency Medicine | Admitting: Emergency Medicine

## 2020-06-21 ENCOUNTER — Encounter (HOSPITAL_COMMUNITY): Payer: Self-pay | Admitting: Emergency Medicine

## 2020-06-21 ENCOUNTER — Emergency Department (HOSPITAL_COMMUNITY): Payer: No Typology Code available for payment source

## 2020-06-21 ENCOUNTER — Other Ambulatory Visit: Payer: Self-pay

## 2020-06-21 DIAGNOSIS — M545 Low back pain, unspecified: Secondary | ICD-10-CM | POA: Diagnosis not present

## 2020-06-21 DIAGNOSIS — F17210 Nicotine dependence, cigarettes, uncomplicated: Secondary | ICD-10-CM | POA: Insufficient documentation

## 2020-06-21 DIAGNOSIS — J45909 Unspecified asthma, uncomplicated: Secondary | ICD-10-CM | POA: Diagnosis not present

## 2020-06-21 DIAGNOSIS — M549 Dorsalgia, unspecified: Secondary | ICD-10-CM | POA: Diagnosis present

## 2020-06-21 MED ORDER — LIDOCAINE 5 % EX PTCH: 1.0000 | MEDICATED_PATCH | CUTANEOUS | 0 refills | Status: DC

## 2020-06-21 MED ORDER — ONDANSETRON 4 MG PO TBDP
4.0000 mg | ORAL_TABLET | Freq: Once | ORAL | Status: AC
  Administered 2020-06-21: 4 mg via ORAL
  Filled 2020-06-21: qty 1

## 2020-06-21 MED ORDER — OXYCODONE HCL 5 MG PO TABS: 5.0000 mg | ORAL_TABLET | ORAL | 0 refills | Status: DC | PRN

## 2020-06-21 MED ORDER — OXYCODONE HCL 5 MG PO TABS
5.0000 mg | ORAL_TABLET | Freq: Once | ORAL | Status: AC
  Administered 2020-06-21: 5 mg via ORAL
  Filled 2020-06-21 (×2): qty 1

## 2020-06-21 NOTE — ED Triage Notes (Signed)
Pt with c/o lower back pain since Wednesday. Denies injury> states he has been applying heat, taking Tylenol, Ibuprofen, and Flexeril without relief.

## 2020-06-21 NOTE — Discharge Instructions (Signed)
You were evaluated in the Emergency Department and after careful evaluation, we did not find any emergent condition requiring admission or further testing in the hospital.  Your exam/testing today was overall reassuring.  Symptoms may be due to muscle sprain of the back or possibly a herniated disc.  Please continue taking 1000 mg Tylenol and/or 600 mg ibuprofen every 4-6 hours for pain.  You can also use the Lidoderm numbing patches daily as needed.  Use the oxycodone for more significant pain keeping you from sleeping.  If not improved in 3 to 4 weeks, recommend follow-up with a back specialist  Please return to the Emergency Department if you experience any worsening of your condition.  Thank you for allowing [place] to be a part of your care.

## 2020-06-21 NOTE — ED Provider Notes (Signed)
[facility]-EM[facility]Y DEPT [facility] Emergency Department Provider Note MRN:  [id number]  Arrival date & time: 06/21/20     Chief Complaint   Back Pain   History of Present Illness   Robert Gordon is a 55 y.o. year-old male with a history of avascular necrosis, A. fib presenting to the ED with chief complaint of back pain.  Back pain for the past week.  Was initially mild but then he bent down while at work and during that process the pain became much more severe.  Pain not getting better despite home Tylenol, Motrin, Flexeril.  Pain located in the tailbone region, nonradiating.  Denies any numbness or weakness to the arms or legs, no bowel or bladder dysfunction, no burning with urination, no abdominal pain, no testicular pain, no chest pain, no fever, no other complaints.  Does not use anticoagulation.  Review of Systems  A complete 10 system review of systems was obtained and all systems are negative except as noted in the HPI and PMH.   Patient's Health History    Past Medical History:  Diagnosis Date  . Arthritis   . Asthma   . Avascular necrosis of bones of both hips (HCC)   . Diverticulitis    s/p sigmoid colectomy  . GERD (gastroesophageal reflux disease)   . Hyperlipidemia   . IBS (irritable bowel syndrome)   . Paroxysmal atrial fibrillation [facility])    Diagnosed March 2022    Past Surgical History:  Procedure Laterality Date  . COLONOSCOPY N/A 10/02/2012   Procedure: COLONOSCOPY;  Surgeon: West Bali, MD;  Location: [facility] ENDO SUITE;  Service: Endoscopy;  Laterality: N/A;  11:00  . HIP SURGERY Left   . LAPAROSCOPIC SIGMOID COLECTOMY  09/11/2006  . SIGMOIDOSCOPY  08/14/2006   IOM:[id number] diverticula seen beginning 20 cm from the anal verge and extending to 50 cm from the anal verge/Normal retroflexed view of the rectum.  . TONSILLECTOMY      Family History  Problem Relation Age of Onset  . Colon polyps Mother        pre-cancerous    Social History    Socioeconomic History  . Marital status: Married    Spouse name: Not on file  . Number of children: Not on file  . Years of education: Not on file  . Highest education level: Not on file  Occupational History  . Occupation: Transport planner: [employer]  Tobacco Use  . Smoking status: Current Some Day Smoker    Packs/day: 0.50    Types: Cigarettes  . Smokeless tobacco: Never Used  Substance and Sexual Activity  . Alcohol use: No  . Drug use: No  . Sexual activity: Not on file  Other Topics Concern  . Not on file  Social History Narrative  . Not on file   Social Determinants of Health   Financial Resource Strain: Not on file  Food Insecurity: Not on file  Transportation Needs: Not on file  Physical Activity: Not on file  Stress: Not on file  Social Connections: Not on file  Intimate Partner Violence: Not on file     Physical Exam   Vitals:   06/21/20 0621  BP: 108/70  Pulse: 80  Resp: 18  Temp: 98.8 F (37.1 C)  SpO2: 98%    CONSTITUTIONAL: Well-appearing, NAD NEURO:  Alert and oriented x 3, no focal deficits EYES:  eyes equal and reactive ENT/NECK:  no LAD, no JVD CARDIO: Regular rate,  well-perfused, normal S1 and S2 PULM:  CTAB no wheezing or rhonchi GI/GU:  normal bowel sounds, non-distended, non-tender MSK/SPINE:  No gross deformities, no edema SKIN:  no rash, atraumatic PSYCH:  Appropriate speech and behavior  *Additional and/or pertinent findings included in MDM below  Diagnostic and Interventional Summary    EKG Interpretation  Date/Time:    Ventricular Rate:    PR Interval:    QRS Duration:   QT Interval:    QTC Calculation:   R Axis:     Text Interpretation:        Labs Reviewed - No data to display  DG Lumbar Spine Complete    (Results Pending)    Medications  oxyCODONE (Oxy IR/ROXICODONE) immediate release tablet 5 mg (5 mg Oral Given 06/21/20 0648)  ondansetron (ZOFRAN-ODT) disintegrating  tablet 4 mg (4 mg Oral Given 06/21/20 1740)     Procedures  /  Critical Care Procedures  ED Course and Medical Decision Making  I have reviewed the triage vital signs, the nursing notes, and pertinent available records from the EMR.  Listed above are laboratory and imaging tests that I personally ordered, reviewed, and interpreted and then considered in my medical decision making (see below for details).  Long history of intermittent back pain, has not had a flare in a while.  Overall reassuring neurological exam, preserved strength and sensation, preserved deep tendon reflexes bilaterally.  No red flag symptoms to suggest myelopathy.  Providing oxycodone.  Given history of avascular necrosis of the hips, will obtain screening x-ray of the lumbar spine.  Anticipating discharge with pain control plan.     X-ray pending, if without significant findings anticipating discharge.  Signed out to to oncoming provider at shift change.  Elmer Sow. Pilar Plate, MD [facility] Emergency Medicine [facility] [email or web address]  Final Clinical Impressions(s) / ED Diagnoses     ICD-10-CM   1. Acute midline low back pain without sciatica  M54.50     ED Discharge Orders         Ordered    oxyCODONE (ROXICODONE) 5 MG immediate release tablet  Every 4 hours PRN        06/21/20 0719    lidocaine (LIDODERM) 5 %  Every 24 hours        06/21/20 0719           Discharge Instructions Discussed with and Provided to Patient:     Discharge Instructions     You were evaluated in the Emergency Department and after careful evaluation, we did not find any emergent condition requiring admission or further testing in the hospital.  Your exam/testing today was overall reassuring.  Symptoms may be due to muscle sprain of the back or possibly a herniated disc.  Please continue taking 1000 mg Tylenol and/or 600 mg ibuprofen every 4-6 hours for pain.  You can also use the Lidoderm numbing patches  daily as needed.  Use the oxycodone for more significant pain keeping you from sleeping.  If not improved in 3 to 4 weeks, recommend follow-up with a back specialist  Please return to the Emergency Department if you experience any worsening of your condition.  Thank you for allowing [place] to be a part of your care.        Sabas Sous, MD 06/21/20 [phone]

## 2020-09-10 DIAGNOSIS — Z20822 Contact with and (suspected) exposure to covid-19: Secondary | ICD-10-CM | POA: Diagnosis not present

## 2020-09-16 ENCOUNTER — Ambulatory Visit
Admission: EM | Admit: 2020-09-16 | Discharge: 2020-09-16 | Disposition: A | Discharge status: Home / Self Care | Payer: No Typology Code available for payment source | Attending: Family Medicine | Admitting: Family Medicine

## 2020-09-16 ENCOUNTER — Other Ambulatory Visit: Payer: Self-pay

## 2020-09-16 DIAGNOSIS — J0100 Acute maxillary sinusitis, unspecified: Secondary | ICD-10-CM

## 2020-09-16 DIAGNOSIS — J4541 Moderate persistent asthma with (acute) exacerbation: Secondary | ICD-10-CM

## 2020-09-16 DIAGNOSIS — U071 COVID-19: Secondary | ICD-10-CM

## 2020-09-16 MED ORDER — PREDNISONE 20 MG PO TABS: 40.0000 mg | ORAL_TABLET | Freq: Every day | ORAL | 0 refills | Status: DC

## 2020-09-16 MED ORDER — AZITHROMYCIN 250 MG PO TABS: 250.0000 mg | ORAL_TABLET | Freq: Every day | ORAL | 0 refills | Status: DC

## 2020-09-16 NOTE — ED Provider Notes (Signed)
[facility]   [id number] 09/16/20 Arrival Time: 9735  ASSESSMENT & PLAN:  1. COVID-19 virus infection   2. Acute non-recurrent maxillary sinusitis   3. Moderate persistent asthma with acute exacerbation     Discussed typical duration of viral illnesses. OTC symptom care as needed.  Meds ordered this encounter  Medications   azithromycin (ZITHROMAX) 250 MG tablet    Sig: Take 1 tablet (250 mg total) by mouth daily. Take first 2 tablets together, then 1 every day until finished.    Dispense:  6 tablet    Refill:  0   predniSONE (DELTASONE) 20 MG tablet    Sig: Take 2 tablets (40 mg total) by mouth daily.    Dispense:  10 tablet    Refill:  0     Follow-up Information     Center[facility].   Specialty: General Practice Why: As needed. Contact information: [address] [place] [address] [phone]                 Reviewed expectations re: course of current medical issues. Questions answered. Outlined signs and symptoms indicating need for more acute intervention. Understanding verbalized. After Visit Summary given.   SUBJECTIVE: History from: patient. Robert Gordon is a 55 y.o. male who reports positive COVID test at home. Sick/URI symptoms > 1 week. Subj fever and cough. Now with facial pain/pressure/nasal congestion; blowing thick mucous. Also reports worsening of asthma; wheezing. No CP/SOB reported. Normal PO intake without n/v/d.   OBJECTIVE:  Vitals:   09/16/20 1026  BP: 128/83  Pulse: (!) 54  Resp: 20  Temp: 98.3 F (36.8 C)  SpO2: 97%    General appearance: alert; no distress Eyes: PERRLA; EOMI; conjunctiva normal HENT: [place]; AT; with nasal congestion Neck: supple  Lungs: speaks full sentences without difficulty; unlabored; dry cough; bilateral exp wheezing present Extremities: no edema Skin: warm and dry Neurologic: normal gait Psychological: alert and cooperative; normal mood and affect  Allergies  Allergen  Reactions   Dextromethorphan Other (See Comments)    Thinks it may be a causative agent in the asthma attacks, unsure. Had an attack one time after taking cough medicine containing dextromethorphan.    Dm [Dextromethorphan-Guaifenesin] Other (See Comments)    Hurts chest.   Lactaid [Lactase] Diarrhea, Nausea And Vomiting and Other (See Comments)    Abdominal cramping/bloating    Past Medical History:  Diagnosis Date   Arthritis    Asthma    Avascular necrosis of bones of both hips (HCC)    Diverticulitis    s/p sigmoid colectomy   GERD (gastroesophageal reflux disease)    Hyperlipidemia    IBS (irritable bowel syndrome)    Paroxysmal atrial fibrillation [facility])    Diagnosed March 2022   Social History   Socioeconomic History   Marital status: Married    Spouse name: Not on file   Number of children: Not on file   Years of education: Not on file   Highest education level: Not on file  Occupational History   Occupation: Transport planner: [employer]  Tobacco Use   Smoking status: Some Days    Packs/day: 0.50    Pack years: 0.00    Types: Cigarettes   Smokeless tobacco: Never  Substance and Sexual Activity   Alcohol use: No   Drug use: No   Sexual activity: Not on file  Other Topics Concern   Not on file  Social History  Narrative   Not on file   Social Determinants of Health   Financial Resource Strain: Not on file  Food Insecurity: Not on file  Transportation Needs: Not on file  Physical Activity: Not on file  Stress: Not on file  Social Connections: Not on file  Intimate Partner Violence: Not on file   Family History  Problem Relation Age of Onset   Colon polyps Mother        pre-cancerous   Past Surgical History:  Procedure Laterality Date   COLONOSCOPY N/A 10/02/2012   Procedure: COLONOSCOPY;  Surgeon: West Bali, MD;  Location: [facility] ENDO SU[facility];  Service: Endoscopy;  Laterality: N/A;  11:00   HIP SURGERY Left     LAPAROSCOPIC SIGMOID COLECTOMY  09/11/2006   SIGMOIDOSCOPY  08/14/2006   DSK:[id number] diverticula seen beginning 20 cm from the anal verge and extending to 50 cm from the anal verge/Normal retroflexed view of the rectum.   Greig Right, MD 09/16/20 [phone]

## 2020-09-16 NOTE — ED Triage Notes (Signed)
Pt presents with c/o cough and fever , home covid test positive

## 2020-09-29 ENCOUNTER — Ambulatory Visit (INDEPENDENT_AMBULATORY_CARE_PROVIDER_SITE_OTHER): Payer: Federal, State, Local not specified - PPO | Admitting: Cardiology

## 2020-09-29 ENCOUNTER — Encounter: Payer: Self-pay | Admitting: Cardiology

## 2020-09-29 ENCOUNTER — Other Ambulatory Visit: Payer: Self-pay

## 2020-09-29 VITALS — BP 126/80 | HR 80 | Ht 69.0 in | Wt 207.0 lb

## 2020-09-29 DIAGNOSIS — I480 Paroxysmal atrial fibrillation: Secondary | ICD-10-CM

## 2020-09-29 DIAGNOSIS — E782 Mixed hyperlipidemia: Secondary | ICD-10-CM | POA: Diagnosis not present

## 2020-09-29 NOTE — Progress Notes (Signed)
Cardiology Office Note  Date: 09/29/2020   ID: LORRAINE TERRIQUEZ, DOB [birth year], MRN [id number]  PCP:  [facility]  Cardiologist:  Nona Dell, MD Electrophysiologist:  None   Chief Complaint  Patient presents with   Cardiac follow-up    History of Present Illness: Robert Gordon is a 55 y.o. male last seen in March.  He is here for a follow-up visit.  Reports less sense of palpitations, has only used short acting Cardizem a few times since last encounter.  He is tolerating Cardizem CD 120 mg daily.  States that he had COVID-19 in the interim, just returned to work.    We talked about a walking plan for exercise, he had not been in a typical routine.  Previously enjoyed exercising regularly.  Past Medical History:  Diagnosis Date   Arthritis    Asthma    Avascular necrosis of bones of both hips (HCC)    Diverticulitis    s/p sigmoid colectomy   GERD (gastroesophageal reflux disease)    Hyperlipidemia    IBS (irritable bowel syndrome)    Paroxysmal atrial fibrillation [facility])    Diagnosed March 2022    Past Surgical History:  Procedure Laterality Date   COLONOSCOPY N/A 10/02/2012   Procedure: COLONOSCOPY;  Surgeon: West Bali, MD;  Location: [facility] SU[facility];  Service: Endoscopy;  Laterality: N/A;  11:00   HIP SURGERY Left    LAPAROSCOPIC SIGMOID COLECTOMY  09/11/2006   SIGMOIDOSCOPY  08/14/2006   TMH:[id number] diverticula seen beginning 20 cm from the anal verge and extending to 50 cm from the anal verge/Normal retroflexed view of the rectum.   TONSILLECTOMY      Current Outpatient Medications  Medication Sig Dispense Refill   albuterol (PROVENTIL) (2.5 MG/3ML) 0.083% nebulizer solution Take 2.5 mg by nebulization every 4 (four) hours as needed for shortness of breath. For shortness of breath     albuterol (VENTOLIN HFA) 108 (90 Base) MCG/ACT inhaler Inhale 1 puff into the lungs every 6 (six) hours as needed for wheezing or shortness of breath.      atorvastatin (LIPITOR) 40 MG tablet Take 40 mg by mouth at bedtime.     budesonide-formoterol (SYMBICORT) 160-4.5 MCG/ACT inhaler Inhale 2 puffs into the lungs 2 (two) times daily. Pt currently out of medication but normally takes the medication everyday     buPROPion ER (WELLBUTRIN SR) 100 MG 12 hr tablet Take 1 tablet by mouth daily.     Cholecalciferol 50 MCG (2000 UT) TABS Take 1 tablet by mouth daily.     diltiazem (CARDIZEM CD) 120 MG 24 hr capsule Take 1 capsule (120 mg total) by mouth daily. 90 capsule 3   diltiazem (CARDIZEM) 30 MG tablet Take 1 tablet (30 mg total) by mouth as needed. For palpitations.  Take 1 tablet if palpitations do not stop and 1 hour.  Take second tablet.  If they do not stop after that seek medical attention. 20 tablet 0   gabapentin (NEURONTIN) 100 MG capsule Take by mouth.     ipratropium (ATROVENT) 0.02 % nebulizer solution Take 500 mcg by nebulization every 4 (four) hours as needed. For shortness of breath     lidocaine (LIDODERM) 5 % Place 1 patch onto the skin daily. Remove & Discard patch within 12 hours or as directed by MD 5 patch 0   predniSONE (DELTASONE) 20 MG tablet Take 2 tablets (40 mg total) by mouth daily. 10 tablet 0   azithromycin (  ZITHROMAX) 250 MG tablet Take 1 tablet (250 mg total) by mouth daily. Take first 2 tablets together, then 1 every day until finished. (Patient not taking: Reported on 09/29/2020) 6 tablet 0   Omega-3 Fatty Acids (FISH OIL) 1000 MG CAPS Take 1 capsule by mouth daily. (Patient not taking: Reported on 09/29/2020)     oxyCODONE (ROXICODONE) 5 MG immediate release tablet Take 1 tablet (5 mg total) by mouth every 4 (four) hours as needed for severe pain. (Patient not taking: Reported on 09/29/2020) 8 tablet 0   No current facility-administered medications for this visit.   Allergies:  Dextromethorphan, Dm [dextromethorphan-guaifenesin], and Lactaid [lactase]   ROS: No dizziness or syncope.  Physical Exam: VS:  BP 126/80    Pulse 80   Ht 5\' 9"  (1.753 m)   Wt 207 lb (93.9 kg)   SpO2 97%   BMI 30.57 kg/m , BMI Body mass index is 30.57 kg/m.  Wt Readings from Last 3 Encounters:  09/29/20 207 lb (93.9 kg)  06/21/20 205 lb 0.4 oz (93 kg)  06/09/20 204 lb (92.5 kg)    General: Patient appears comfortable at rest. HEENT: Conjunctiva and lids normal, wearing a mask. Lungs: Clear to auscultation, nonlabored breathing at rest. Cardiac: Regular rate and rhythm, no S3 or significant systolic murmur, no pericardial rub.  ECG:  An ECG dated 05/25/2020 was personally reviewed today and demonstrated:  Sinus tachycardia.  Recent Labwork: 06/05/2020: ALT 27; AST 22; BUN 14; Creatinine, Ser 1.17; Hemoglobin 15.7; Platelets 263; Potassium 3.8; Sodium 140; TSH 1.145   Other Studies Reviewed Today:  Echocardiogram 06/05/2020:  1. Left ventricular ejection fraction, by estimation, is 55 to 60%. The  left ventricle has normal function. The left ventricle has no regional  wall motion abnormalities. Left ventricular diastolic parameters were  normal.   2. Right ventricular systolic function is normal. The right ventricular  size is normal.   3. The mitral valve is normal in structure. Trivial mitral valve  regurgitation.   4. The aortic valve is normal in structure. Aortic valve regurgitation is  not visualized.   5. Aortic dilatation noted. There is mild dilatation of the aortic root,  measuring 38 mm.   6. The inferior vena cava is normal in size with greater than 50%  respiratory variability, suggesting right atrial pressure of 3 mmHg.  Assessment and Plan:  1.  Paroxysmal atrial fibrillation/atypical atrial flutter.  CHA2DS2-VASc score is 0.  Plan to continue Cardizem CD 120 mg daily with as needed short acting Cardizem for breakthrough palpitations.  Encouraged regular walking plan for exercise.  2.  Mixed hyperlipidemia, he continues on Lipitor with follow-up by PCP.  Medication Adjustments/Labs and Tests  Ordered: Current medicines are reviewed at length with the patient today.  Concerns regarding medicines are outlined above.   Tests Ordered: No orders of the defined types were placed in this encounter.   Medication Changes: No orders of the defined types were placed in this encounter.   Disposition:  Follow up  6 months.  Signed, 06/07/2020, MD, [facility] 09/29/2020 2:44 PM    [facility] at [facility] [address] [place], [place] Phone: [phone]; Fax: [phone]

## 2020-09-29 NOTE — Patient Instructions (Signed)

## 2021-03-24 ENCOUNTER — Telehealth: Payer: Self-pay | Admitting: Cardiology

## 2021-03-24 MED ORDER — DILTIAZEM HCL 30 MG PO TABS: 30.0000 mg | ORAL_TABLET | ORAL | 6 refills | Status: AC | PRN

## 2021-03-24 NOTE — Telephone Encounter (Signed)
Completed.

## 2021-03-24 NOTE — Telephone Encounter (Signed)
°*  STAT* If patient is at the pharmacy, call can be transferred to refill team.   1. Which medications need to be refilled? (please list name of each medication and dose if known) diltiazem (CARDIZEM) 30 MG tablet  2. Which pharmacy/location (including street and city if local pharmacy) is medication to be sent to? [id number] - [place], [address]  3. Do they need a 30 day or 90 day supply? 30 day   Patient is completely out of medication

## 2021-04-19 ENCOUNTER — Encounter (HOSPITAL_COMMUNITY): Payer: Self-pay | Admitting: *Deleted

## 2021-04-19 ENCOUNTER — Emergency Department (HOSPITAL_COMMUNITY)
Admission: EM | Admit: 2021-04-19 | Discharge: 2021-04-19 | Disposition: A | Discharge status: Home / Self Care | Payer: No Typology Code available for payment source | Attending: Emergency Medicine | Admitting: Emergency Medicine

## 2021-04-19 ENCOUNTER — Emergency Department (HOSPITAL_COMMUNITY): Payer: No Typology Code available for payment source

## 2021-04-19 ENCOUNTER — Other Ambulatory Visit: Payer: Self-pay

## 2021-04-19 DIAGNOSIS — R42 Dizziness and giddiness: Secondary | ICD-10-CM | POA: Insufficient documentation

## 2021-04-19 DIAGNOSIS — Z79899 Other long term (current) drug therapy: Secondary | ICD-10-CM | POA: Insufficient documentation

## 2021-04-19 DIAGNOSIS — R008 Other abnormalities of heart beat: Secondary | ICD-10-CM | POA: Diagnosis present

## 2021-04-19 DIAGNOSIS — I480 Paroxysmal atrial fibrillation: Secondary | ICD-10-CM

## 2021-04-19 LAB — CBC
HCT: 45.8 % (ref 39.0–52.0)
Hemoglobin: 14.8 g/dL (ref 13.0–17.0)
MCH: 29.8 pg (ref 26.0–34.0)
MCHC: 32.3 g/dL (ref 30.0–36.0)
MCV: 92.3 fL (ref 80.0–100.0)
Platelets: 256 10*3/uL (ref 150–400)
RBC: 4.96 MIL/uL (ref 4.22–5.81)
RDW: 12.8 % (ref 11.5–15.5)
WBC: 8.3 10*3/uL (ref 4.0–10.5)
nRBC: 0 % (ref 0.0–0.2)

## 2021-04-19 LAB — BASIC METABOLIC PANEL
Anion gap: 11 (ref 5–15)
BUN: 14 mg/dL (ref 6–20)
CO2: 21 mmol/L — ABNORMAL LOW (ref 22–32)
Calcium: 9.2 mg/dL (ref 8.9–10.3)
Chloride: 106 mmol/L (ref 98–111)
Creatinine, Ser: 1.14 mg/dL (ref 0.61–1.24)
GFR, Estimated: >60 mL/min (ref >60)
Glucose, Bld: 125 mg/dL — ABNORMAL HIGH (ref 70–99)
Potassium: 3.3 mmol/L — ABNORMAL LOW (ref 3.5–5.1)
Sodium: 138 mmol/L (ref 135–145)

## 2021-04-19 LAB — TROPONIN I (HIGH SENSITIVITY)
Troponin I (High Sensitivity): 5 ng/L (ref <18)
Troponin I (High Sensitivity): 5 ng/L (ref <18)

## 2021-04-19 LAB — MAGNESIUM: Magnesium: 2.2 mg/dL (ref 1.7–2.4)

## 2021-04-19 MED ORDER — MAGNESIUM OXIDE -MG SUPPLEMENT 400 (240 MG) MG PO TABS: 400.0000 mg | ORAL_TABLET | Freq: Once | ORAL | Status: DC

## 2021-04-19 MED ORDER — DILTIAZEM HCL 30 MG PO TABS
120.0000 mg | ORAL_TABLET | Freq: Once | ORAL | Status: AC
  Administered 2021-04-19: 120 mg via ORAL
  Filled 2021-04-19: qty 4

## 2021-04-19 MED ORDER — SODIUM CHLORIDE 0.9 % IV BOLUS
1000.0000 mL | Freq: Once | INTRAVENOUS | Status: AC
  Administered 2021-04-19: 1000 mL via INTRAVENOUS

## 2021-04-19 MED ORDER — POTASSIUM CHLORIDE CRYS ER 20 MEQ PO TBCR
40.0000 meq | EXTENDED_RELEASE_TABLET | Freq: Once | ORAL | Status: AC
  Administered 2021-04-19: 40 meq via ORAL
  Filled 2021-04-19: qty 2

## 2021-04-19 MED ORDER — DILTIAZEM HCL ER COATED BEADS 240 MG PO TB24: 240.0000 mg | ORAL_TABLET | Freq: Every day | ORAL | 0 refills | Status: DC

## 2021-04-19 NOTE — Discharge Instructions (Addendum)
you go from sinus rhythm to A-fib, I have upped your Cardizem from 120 daily to 240 daily you have received 240 already today, starting tomorrow  start taking to 240 mg of Cardizem daily.  Please follow-up with the atrial fibrillation clinic given contact info above please call.   Come back to the emergency department if you develop chest pain, shortness of breath, severe abdominal pain, uncontrolled nausea, vomiting, diarrhea.

## 2021-04-19 NOTE — ED Triage Notes (Signed)
Irregular heart rate for days

## 2021-04-19 NOTE — ED Provider Notes (Signed)
[facility] Provider Note   CSN: [id number] Arrival date & time: 04/19/21  1131     History  Chief Complaint  Patient presents with   Chest Pain    Robert Gordon is a 56 y.o. male.  HPI  Patient with medical history including, GERD, proximal A-fib currently not on anticoagulants presents to the emergency department with complaints of irregular heart rate.  Patient states he has been experiencing this for the last month time but has become more persistent.  He states that he will get brief episodes of heart palpitations will last a couple seconds and then resolve, he states he mainly feels this at nighttime or when he is exerting himself, has no actual chest pain but will feel slightly dizzy no shortness of breath no pleuritic chest pain.  He denies increased caffeine consumption, denies dehydration, states he has been compliant with his medications.  Patient has no cardiac history, no history of PEs or DVTs currently not on hormone therapy, states he been taking Cardizem but it has not been helping very much.  He has no other complaints at this time.  His endorse fevers chills chest pain shortness of breath general body aches.  Reviewed patient's chart patient is currently being followed by Dr. Domenic Polite of cardiology was assessed in October, he was placed on diltiazem 120 mg daily and instructed to take an extra 120 IV feels consistent heart palpitations.  That CHADS2 score was 0 at that time.    Home Medications Prior to Admission medications   Medication Sig Start Date End Date Taking? Authorizing Provider  diltiazem (CARDIZEM LA) 240 MG 24 hr tablet Take 1 tablet (240 mg total) by mouth daily. 04/19/21 05/19/21 Yes Marcello Fennel, PA-C  gabapentin (NEURONTIN) 100 MG capsule TAKE ONE CAPSULE BY MOUTH EVERY DAY AS NEEDED MAY INCREASE TO 3 CAPSULES (300MG ) IF NEEDED AS DIRECTED 07/23/20  Yes [provider]  albuterol (PROVENTIL) (2.5 MG/3ML) 0.083%  nebulizer solution Take 2.5 mg by nebulization every 4 (four) hours as needed for shortness of breath. For shortness of breath    [provider]  albuterol (VENTOLIN HFA) 108 (90 Base) MCG/ACT inhaler Inhale 1 puff into the lungs every 6 (six) hours as needed for wheezing or shortness of breath.    [provider]  atorvastatin (LIPITOR) 40 MG tablet Take 40 mg by mouth at bedtime.    [provider]  azithromycin (ZITHROMAX) 250 MG tablet Take 1 tablet (250 mg total) by mouth daily. Take first 2 tablets together, then 1 every day until finished. Patient not taking: Reported on 09/29/2020 09/16/20   Vanessa Kick, MD  budesonide-formoterol [facility]) 160-4.5 MCG/ACT inhaler Inhale 2 puffs into the lungs 2 (two) times daily. Pt currently out of medication but normally takes the medication everyday    [provider]  buPROPion ER (WELLBUTRIN SR) 100 MG 12 hr tablet Take 1 tablet by mouth daily. 07/23/20   [provider]  Cholecalciferol 50 MCG (2000 UT) TABS Take 1 tablet by mouth daily. 07/19/19   [provider]  diltiazem (CARDIZEM) 30 MG tablet Take 1 tablet (30 mg total) by mouth as needed. For palpitations.  Take 1 tablet if palpitations do not stop and 1 hour.  Take second tablet.  If they do not stop after that seek medical attention. 03/24/21   Satira Sark, MD  gabapentin (NEURONTIN) 100 MG capsule Take by mouth. 07/23/20   [provider]  ipratropium (ATROVENT)  0.02 % nebulizer solution Take 500 mcg by nebulization every 4 (four) hours as needed. For shortness of breath    [provider]  lidocaine (LIDODERM) 5 % Place 1 patch onto the skin daily. Remove & Discard patch within 12 hours or as directed by MD 06/21/20   Maudie Flakes, MD  Omega-3 Fatty Acids (FISH OIL) 1000 MG CAPS Take 1 capsule by mouth daily. Patient not taking: Reported on 09/29/2020    [provider]  oxyCODONE (ROXICODONE) 5 MG immediate  release tablet Take 1 tablet (5 mg total) by mouth every 4 (four) hours as needed for severe pain. Patient not taking: Reported on 09/29/2020 06/21/20   Maudie Flakes, MD  predniSONE (DELTASONE) 20 MG tablet Take 2 tablets (40 mg total) by mouth daily. 09/16/20   Vanessa Kick, MD      Allergies    Dextromethorphan, Dm [dextromethorphan-guaifenesin], and Lactaid [tilactase]    Review of Systems   Review of Systems  Constitutional:  Negative for chills and fever.  Respiratory:  Negative for shortness of breath.   Cardiovascular:  Positive for palpitations. Negative for chest pain.  Gastrointestinal:  Negative for abdominal pain, diarrhea and vomiting.  Neurological:  Positive for dizziness. Negative for headaches.   Physical Exam Updated Vital Signs BP 108/72    Pulse 74    Temp 98.1 F (36.7 C) (Oral)    Resp 16    SpO2 99%  Physical Exam Vitals and nursing note reviewed.  Constitutional:      General: He is not in acute distress.    Appearance: He is not ill-appearing.  HENT:     Head: Normocephalic and atraumatic.     Nose: No congestion.  Eyes:     Conjunctiva/sclera: Conjunctivae normal.  Cardiovascular:     Rate and Rhythm: Regular rhythm. Tachycardia present.     Pulses: Normal pulses.     Heart sounds: No murmur heard.   No friction rub. No gallop.  Pulmonary:     Effort: No respiratory distress.     Breath sounds: No wheezing, rhonchi or rales.  Abdominal:     Palpations: Abdomen is soft.     Tenderness: There is no abdominal tenderness. There is no right CVA tenderness or left CVA tenderness.  Musculoskeletal:     Right lower leg: No edema.     Left lower leg: No edema.  Skin:    General: Skin is warm and dry.  Neurological:     Mental Status: He is alert.  Psychiatric:        Mood and Affect: Mood normal.    ED Results / Procedures / Treatments   Labs (all labs ordered are listed, but only abnormal results are displayed) Labs Reviewed  BASIC METABOLIC  PANEL - Abnormal; Notable for the following components:      Result Value   Potassium 3.3 (*)    CO2 21 (*)    Glucose, Bld 125 (*)    All other components within normal limits  CBC  MAGNESIUM  TROPONIN I (HIGH SENSITIVITY)  TROPONIN I (HIGH SENSITIVITY)    EKG None  Radiology DG Chest 2 View  Result Date: 04/19/2021 CLINICAL DATA:  Chest pain EXAM: CHEST - 2 VIEW COMPARISON:  06/05/2020 FINDINGS: The heart size and mediastinal contours are within normal limits. Both lungs are clear. The visualized skeletal structures are unremarkable. IMPRESSION: No active cardiopulmonary disease. Electronically Signed   By: Elmer Picker M.D.   On: 04/19/2021 12:27  Procedures Procedures    Medications Ordered in ED Medications  sodium chloride 0.9 % bolus 1,000 mL (0 mLs Intravenous Stopped 04/19/21 1602)  diltiazem (CARDIZEM) tablet 120 mg (120 mg Oral Given 04/19/21 1606)  potassium chloride SA (KLOR-CON M) CR tablet 40 mEq (40 mEq Oral Given 04/19/21 1605)    ED Course/ Medical Decision Making/ A&P                           Medical Decision Making Amount and/or Complexity of Data Reviewed Labs: ordered. Radiology: ordered.  Risk OTC drugs. Prescription drug management.   This patient presents to the ED for concern of heart palpitations, this involves an extensive number of treatment options, and is a complaint that carries with it a high risk of complications and morbidity.  The differential diagnosis includes arrhythmias, PE, ACS, dissection    Additional history obtained:  Additional history obtained from electronic medical record External records from outside source obtained and reviewed including please see HPI for further detail   Co morbidities that complicate the patient evaluation  A-fib  Social Determinants of Health:  N/A    Lab Tests:  I Ordered, and personally interpreted labs.  The pertinent results include: CBC unremarkable BMP shows hypokalemia  3.3 CO2 21 glucose 125 magnesium 2.2 negative delta troponin   Imaging Studies ordered:  I ordered imaging studies including chest x-ray I independently visualized and interpreted imaging which showed unremarkable I agree with the radiologist interpretation   Cardiac Monitoring:  The patient was maintained on a cardiac monitor.  I personally viewed and interpreted the cardiac monitored which showed an underlying rhythm of: EKG sinus tach without signs of ischemia   Medicines ordered and prescription drug management:  I ordered medication including fluids for dehydration I have reviewed the patients home medicines and have made adjustments as needed  Reevaluation:  During my initial evaluation patient will convert from sinus into A-fib rates will get up into the 150s resolved and then go back down into the 70s and 80s.  Due to these rapid conversions will consult cardiology for further recommendations.  Patient is reassessed updated on recommendation from cardiology he is agreement this plan and has no other complaints.  Consultations Obtained:  I requested consultation with the [facility] of cardiology,  and discussed lab and imaging findings as well as pertinent plan - they recommend: Recommends upping patient's diltiazem to 240 correcting hypokalemia following up at the A-fib clinic.   Rule out I have low suspicion for ACS as history is atypical, patient has no cardiac history, EKG without signs of ischemia, patient had a delta troponin.  Low suspicion for PE as patient denies pleuritic chest pain, shortness of breath, patient denies leg pain, no pedal edema noted on exam, vital signs reassuring nontachypneic nonhypoxic afebrile.  Low suspicion for AAA or aortic dissection as history is atypical, patient has low risk factors.  Low solution patient will be started on anticoags as his bad CHADS2 score is 0.  low suspicion for systemic infection as patient is nontoxic-appearing, vital  signs reassuring, no obvious source infection noted on exam.     Dispostion and problem list  After consideration of the diagnostic results and the patients response to treatment, I feel that the patent would benefit from discharge.  Irregular heartbeat-likely patient is going in and out of sinus rhythm to A-fib, will up his Cardizem, and follow-up with the A-fib clinic for further evaluation.  Final Clinical Impression(s) / ED Diagnoses Final diagnoses:  Paroxysmal atrial fibrillation [facility])    Rx / DC Orders ED Discharge Orders          Ordered    diltiazem (CARDIZEM LA) 240 MG 24 hr tablet  Daily        04/19/21 1607              Aron Baba 04/19/21 1612    Milton Ferguson, MD 04/20/21 [phone]

## 2021-04-22 ENCOUNTER — Other Ambulatory Visit: Payer: Self-pay

## 2021-04-22 ENCOUNTER — Ambulatory Visit (HOSPITAL_COMMUNITY)
Admit: 2021-04-22 | Discharge: 2021-04-22 | Disposition: A | Discharge status: Home / Self Care | Payer: Federal, State, Local not specified - PPO | Source: Ambulatory Visit | Attending: Nurse Practitioner | Admitting: Nurse Practitioner

## 2021-04-22 VITALS — BP 120/64 | HR 73 | Ht 69.0 in | Wt 220.4 lb

## 2021-04-22 DIAGNOSIS — I480 Paroxysmal atrial fibrillation: Secondary | ICD-10-CM | POA: Insufficient documentation

## 2021-04-22 DIAGNOSIS — K219 Gastro-esophageal reflux disease without esophagitis: Secondary | ICD-10-CM | POA: Diagnosis not present

## 2021-04-22 DIAGNOSIS — D6869 Other thrombophilia: Secondary | ICD-10-CM

## 2021-04-22 DIAGNOSIS — Z79899 Other long term (current) drug therapy: Secondary | ICD-10-CM | POA: Insufficient documentation

## 2021-04-22 MED ORDER — DILTIAZEM HCL ER COATED BEADS 120 MG PO CP24: 120.0000 mg | ORAL_CAPSULE | Freq: Every day | ORAL | 3 refills | Status: DC

## 2021-04-22 MED ORDER — MULTAQ 400 MG PO TABS: 400.0000 mg | ORAL_TABLET | Freq: Two times a day (BID) | ORAL | 3 refills | Status: DC

## 2021-04-22 NOTE — Progress Notes (Signed)
Primary Care Physician: [facility] [place] Referring Physician: [facility] ED Cardiologist: Dr. Cipriano Bunker is a 56 y.o. male with a h/o GERD,  paroxysmal afib that was seen in the ED, not on anticoagulation with a CHA2DS2VASc  score of 0. He was seen recently in the ED with palpitations. He was noted to go in and out of afib with v rates up to 150's. He was also found to be hypokalemic with a K+ at 3.3. This was replaced. He was increased on Cardizem to  240 mg. He was asked to f/u with the afib clinic.   EKG today shows  NSR at 71 bpm. He states that he has afib every day. Sometimes during the day, but most often at night which interferes with his sleep.  Increase of Cardizem did not lessen his afib burden.   Today, he denies symptoms of palpitations, chest pain, shortness of breath, orthopnea, PND, lower extremity edema, dizziness, presyncope, syncope, or neurologic sequela. The patient is tolerating medications without difficulties and is otherwise without complaint today.   Past Medical History:  Diagnosis Date   Arthritis    Asthma    Avascular necrosis of bones of both hips ([place])    Diverticulitis    s/p sigmoid colectomy   GERD (gastroesophageal reflux disease)    Hyperlipidemia    IBS (irritable bowel syndrome)    Paroxysmal atrial fibrillation [facility])    Diagnosed March 2022   Past Surgical History:  Procedure Laterality Date   COLONOSCOPY N/A 10/02/2012   Procedure: COLONOSCOPY;  Surgeon: Danie Binder, MD;  Location: [facility];  Service: Endoscopy;  Laterality: N/A;  11:00   HIP SURGERY Left    LAPAROSCOPIC SIGMOID COLECTOMY  09/11/2006   SIGMOIDOSCOPY  08/14/2006   [id number] diverticula seen beginning 20 cm from the anal verge and extending to 50 cm from the anal verge/Normal retroflexed view of the rectum.   TONSILLECTOMY      Current Outpatient Medications  Medication Sig Dispense Refill   albuterol (PROVENTIL) (2.5 MG/3ML) 0.083%  nebulizer solution Take 2.5 mg by nebulization every 4 (four) hours as needed for shortness of breath. For shortness of breath     albuterol (VENTOLIN HFA) 108 (90 Base) MCG/ACT inhaler Inhale 1 puff into the lungs every 6 (six) hours as needed for wheezing or shortness of breath.     atorvastatin (LIPITOR) 40 MG tablet Take 40 mg by mouth at bedtime.     azithromycin (ZITHROMAX) 250 MG tablet Take 1 tablet (250 mg total) by mouth daily. Take first 2 tablets together, then 1 every day until finished. (Patient not taking: Reported on 09/29/2020) 6 tablet 0   budesonide-formoterol (SYMBICORT) 160-4.5 MCG/ACT inhaler Inhale 2 puffs into the lungs 2 (two) times daily. Pt currently out of medication but normally takes the medication everyday     buPROPion ER (WELLBUTRIN SR) 100 MG 12 hr tablet Take 1 tablet by mouth daily.     Cholecalciferol 50 MCG (2000 UT) TABS Take 1 tablet by mouth daily.     diltiazem (CARDIZEM LA) 240 MG 24 hr tablet Take 1 tablet (240 mg total) by mouth daily. 30 tablet 0   diltiazem (CARDIZEM) 30 MG tablet Take 1 tablet (30 mg total) by mouth as needed. For palpitations.  Take 1 tablet if palpitations do not stop and 1 hour.  Take second tablet.  If they do not stop after that seek medical attention. 30 tablet 6  gabapentin (NEURONTIN) 100 MG capsule Take by mouth.     gabapentin (NEURONTIN) 100 MG capsule TAKE ONE CAPSULE BY MOUTH EVERY DAY AS NEEDED MAY INCREASE TO 3 CAPSULES (300MG ) IF NEEDED AS DIRECTED     ipratropium (ATROVENT) 0.02 % nebulizer solution Take 500 mcg by nebulization every 4 (four) hours as needed. For shortness of breath     lidocaine (LIDODERM) 5 % Place 1 patch onto the skin daily. Remove & Discard patch within 12 hours or as directed by MD 5 patch 0   Omega-3 Fatty Acids (FISH OIL) 1000 MG CAPS Take 1 capsule by mouth daily. (Patient not taking: Reported on 09/29/2020)     oxyCODONE (ROXICODONE) 5 MG immediate release tablet Take 1 tablet (5 mg total) by  mouth every 4 (four) hours as needed for severe pain. (Patient not taking: Reported on 09/29/2020) 8 tablet 0   predniSONE (DELTASONE) 20 MG tablet Take 2 tablets (40 mg total) by mouth daily. 10 tablet 0   No current facility-administered medications for this encounter.    Allergies  Allergen Reactions   Dextromethorphan Other (See Comments)    Thinks it may be a causative agent in the asthma attacks, unsure. Had an attack one time after taking cough medicine containing dextromethorphan.    Dm [Dextromethorphan-Guaifenesin] Other (See Comments)    Hurts chest.   Lactaid [Tilactase] Diarrhea, Nausea And Vomiting and Other (See Comments)    Abdominal cramping/bloating    Social History   Socioeconomic History   Marital status: Married    Spouse name: Not on file   Number of children: Not on file   Years of education: Not on file   Highest education level: Not on file  Occupational History   Occupation: Theatre stage manager: [employer]  Tobacco Use   Smoking status: Some Days    Packs/day: 0.50    Types: Cigarettes   Smokeless tobacco: Never  Substance and Sexual Activity   Alcohol use: No   Drug use: No   Sexual activity: Not on file  Other Topics Concern   Not on file  Social History Narrative   Not on file   Social Determinants of Health   Financial Resource Strain: Not on file  Food Insecurity: Not on file  Transportation Needs: Not on file  Physical Activity: Not on file  Stress: Not on file  Social Connections: Not on file  Intimate Partner Violence: Not on file    Family History  Problem Relation Age of Onset   Colon polyps Mother        pre-cancerous    ROS- All systems are reviewed and negative except as per the HPI above  Physical Exam: There were no vitals filed for this visit. Wt Readings from Last 3 Encounters:  09/29/20 93.9 kg  06/21/20 93 kg  06/09/20 92.5 kg    Labs: Lab Results  Component Value Date    NA 138 04/19/2021   K 3.3 (L) 04/19/2021   CL 106 04/19/2021   CO2 21 (L) 04/19/2021   GLUCOSE 125 (H) 04/19/2021   BUN 14 04/19/2021   CREATININE 1.14 04/19/2021   CALCIUM 9.2 04/19/2021   PHOS 2.6 12/02/2009   MG 2.2 04/19/2021   Lab Results  Component Value Date   INR 1.00 12/01/2009   Lab Results  Component Value Date   CHOL (H) 12/06/2009    219        ATP III CLASSIFICATION:  <200  mg/dL   Desirable  200-239  mg/dL   Borderline High  >=240    mg/dL   High          HDL 58 12/06/2009   LDLCALC (H) 12/06/2009    133        Total Cholesterol/HDL:CHD Risk Coronary Heart Disease Risk Table                     Men   Women  1/2 Average Risk   3.4   3.3  Average Risk       5.0   4.4  2 X Average Risk   9.6   7.1  3 X Average Risk  23.4   11.0        Use the calculated Patient Ratio above and the CHD Risk Table to determine the patient's CHD Risk.        ATP III CLASSIFICATION (LDL):  <100     mg/dL   Optimal  100-129  mg/dL   Near or Above                    Optimal  130-159  mg/dL   Borderline  160-189  mg/dL   High  >190     mg/dL   Very High   TRIG 140 12/06/2009     GEN- The patient is well appearing, alert and oriented x 3 today.   Head- normocephalic, atraumatic Eyes-  Sclera clear, conjunctiva pink Ears- hearing intact Oropharynx- clear Neck- supple, no JVP Lymph- no cervical lymphadenopathy Lungs- Clear to ausculation bilaterally, normal work of breathing Heart- Regular rate and rhythm, no murmurs, rubs or gallops, PMI not laterally displaced GI- soft, NT, ND, + BS Extremities- no clubbing, cyanosis, or edema MS- no significant deformity or atrophy Skin- no rash or lesion Psych- euthymic mood, full affect Neuro- strength and sensation are intact  EKG- Vent. rate 73 BPM PR interval 134 ms QRS duration 94 ms QT/QTcB 392/431 ms P-R-T axes 86 81 71 Normal sinus rhythm Nonspecific ST abnormality Abnormal ECG When compared with ECG of  06-    Assessment and Plan:  1. Afib Frequent afib breakthrough  Discussion of afib and triggers No triggers identified  Increase of diltiazem 240 mg daily has not significantly reduced his afib burden  We dicussed antiarrythmic's He would like to try Multaq 400 mg bid On starting this drug, he will decrease cardizem  back to 120 mg daily   He will come back to the office in 5 -7 days for EKG on drug If this does not control afib, I will send for afib ablation consult   2. CHA2DS2VASc  score of 0 Pt is aware if he is referred for afib ablation  he will need to start anticoagulation for at least 3 weeks ahead of appointment and 3 months after ablation  I will see back after Multaq start next  week    Butch Penny C. Dulcie Gammon, [facility] [address] [place], [place] [id number] [phone]

## 2021-04-27 NOTE — Progress Notes (Signed)
Cardiology Office Note    Date:  05/10/2021   ID:  Robert Gordon, DOB 07/15/1965, MRN [id number]  PCP:  [facility]  Cardiologist:  Nona DellSamuel McDowell, MD  Electrophysiologist:  None   Chief Complaint: f/u atrial fibrillation  History of Present Illness:   Robert Gordon is a 56 y.o. male with history of PAF/flutter, arthritis, asthma, AVN of the hips, diverticulitis, HLD, GERD, IBS, OSA (presently off CPAP, followed at the [facility]) who presents for 6 month follow-up.   He was initially seen in the ED 05/25/20 for palpitations, found to have atrial fibrillation, and spontaneously converted to NSR, not started on any AVN blocking agents at that time. He was then admitted a few weeks later on 06/05/20 for recurrent atrial fib RVR, seen by cardiology. He was treated with IV diltiazem and again spontaneously converted to NSR. 2D echo showed EF 55-60%, trivial MR, mild dilation of aortic root. CHADSVASC score was felt to be zero so not placed on anticoagulation. PRN diltiazem was prescribed at that time. When seen in close follow-up, long-acting diltiazem was started with plan to refer to EP if symptom frequency became an issue. Dr. Diona BrownerMcDowell commented that he felt hospital EKG was consistent with atrial fibrillation/typical atrial flutter. He was seen again more recently in the ED 04/19/21 with recurrent atrial fibrillation and mild hypokalemia. Potassium was repleted and diltiazem was increased with plan for AFib clinic f/u. He saw Rudi Cocoonna Carroll who recommended initiation of Multaq and decrease in diltiazem back to 120mg  as he was having frequent atrial fib breakthrough at that time. He had a repeat EKG visit 04/29/21 showing NSR. The plan was to refer to EP to consider ablation if this action did not work.  He is seen for follow-up overall doing well. He reports that the Multaq has helped decrease the frequency of palpitations significantly, allowing him to get better sleep. He does report a  hx of OSA but has not been on CPAP for the last year due to an issue with his machine. This has been followed at the [facility]. He also has a history of asthma and reports this has not been as well controlled recently with episodic tightening at night stereotypical of his prior asthma symptoms. (He's had this for many decades.) He feels this is because his wife has been bedridden from migraines recently and unable to vacuum and their daughter's dog has been staying with them - the patient is allergic to dog dander. Using his nebulizer resolves the sensation of tightness. No orthopnea. Denies any chest pain, exertional angina, DOE, edema, or syncope. He is feeling well today and not wheezing. He had excellent questions about returning to exercise and whether to avoiding supplements. We discussed that if he were to consider any supplement he would need to call to clear it with one of our pharmacists.   Labwork independently reviewed: 04/2021 troponin neg x 2, K 3.3, Cr 1.14, CBC wnl, Mg 2.2, glu 125 05/2020 TSH wnl, LFTs wnl   Cardiology Studies:   Studies reviewed are outlined and summarized above. Reports included below if pertinent.   2d echo 05/2020   1. Left ventricular ejection fraction, by estimation, is 55 to 60%. The  left ventricle has normal function. The left ventricle has no regional  wall motion abnormalities. Left ventricular diastolic parameters were  normal.   2. Right ventricular systolic function is normal. The right ventricular  size is normal.   3. The mitral valve is normal  in structure. Trivial mitral valve  regurgitation.   4. The aortic valve is normal in structure. Aortic valve regurgitation is  not visualized.   5. Aortic dilatation noted. There is mild dilatation of the aortic root,  measuring 38 mm.   6. The inferior vena cava is normal in size with greater than 50%  respiratory variability, suggesting right atrial pressure of 3 mmHg.     Past Medical History:   Diagnosis Date   Arthritis    Asthma    Avascular necrosis of bones of both hips (HCC)    Diverticulitis    s/p sigmoid colectomy   GERD (gastroesophageal reflux disease)    Hyperlipidemia    IBS (irritable bowel syndrome)    Paroxysmal atrial fibrillation [facility])    Diagnosed March 2022    Past Surgical History:  Procedure Laterality Date   COLONOSCOPY N/A 10/02/2012   Procedure: COLONOSCOPY;  Surgeon: West Bali, MD;  Location: [facility];  Service: Endoscopy;  Laterality: N/A;  11:00   HIP SURGERY Left    LAPAROSCOPIC SIGMOID COLECTOMY  09/11/2006   SIGMOIDOSCOPY  08/14/2006   [id number] diverticula seen beginning 20 cm from the anal verge and extending to 50 cm from the anal verge/Normal retroflexed view of the rectum.   TONSILLECTOMY      Current Medications: Current Meds  Medication Sig   albuterol (PROVENTIL) (2.5 MG/3ML) 0.083% nebulizer solution Take 2.5 mg by nebulization every 4 (four) hours as needed for shortness of breath. For shortness of breath   albuterol (VENTOLIN HFA) 108 (90 Base) MCG/ACT inhaler Inhale 1 puff into the lungs every 6 (six) hours as needed for wheezing or shortness of breath.   atorvastatin (LIPITOR) 40 MG tablet Take 40 mg by mouth at bedtime.   budesonide-formoterol (SYMBICORT) 160-4.5 MCG/ACT inhaler Inhale 2 puffs into the lungs 2 (two) times daily. Pt currently out of medication but normally takes the medication everyday   buPROPion ER (WELLBUTRIN SR) 100 MG 12 hr tablet Take 1 tablet by mouth daily.   Cholecalciferol 50 MCG (2000 UT) TABS Take 1 tablet by mouth daily.   diltiazem (CARDIZEM CD) 120 MG 24 hr capsule Take 1 capsule (120 mg total) by mouth daily.   diltiazem (CARDIZEM) 30 MG tablet Take 1 tablet (30 mg total) by mouth as needed. For palpitations.  Take 1 tablet if palpitations do not stop and 1 hour.  Take second tablet.  If they do not stop after that seek medical attention.   dronedarone (MULTAQ) 400 MG tablet Take 1  tablet (400 mg total) by mouth 2 (two) times daily with a meal.   gabapentin (NEURONTIN) 100 MG capsule TAKE ONE CAPSULE BY MOUTH EVERY DAY AS NEEDED MAY INCREASE TO 3 CAPSULES (300MG ) IF NEEDED AS DIRECTED   ipratropium (ATROVENT) 0.02 % nebulizer solution Take 500 mcg by nebulization every 4 (four) hours as needed. For shortness of breath   loratadine (CLARITIN) 10 MG tablet Take 10 mg by mouth daily.   Potassium 99 MG TABS Take by mouth.   predniSONE (DELTASONE) 20 MG tablet Take 2 tablets (40 mg total) by mouth daily. (Patient taking differently: Take 40 mg by mouth as needed.)      Allergies:   Dextromethorphan, Dm [dextromethorphan-guaifenesin], and Lactaid [tilactase]   Social History   Socioeconomic History   Marital status: Married    Spouse name: Not on file   Number of children: Not on file   Years of education: Not on file  Highest education level: Not on file  Occupational History   Occupation: Transport planner: [employer]  Tobacco Use   Smoking status: Some Days    Packs/day: 0.50    Types: Cigarettes   Smokeless tobacco: Never  Vaping Use   Vaping Use: Never used  Substance and Sexual Activity   Alcohol use: No   Drug use: No   Sexual activity: Not on file  Other Topics Concern   Not on file  Social History Narrative   Not on file   Social Determinants of Health   Financial Resource Strain: Not on file  Food Insecurity: Not on file  Transportation Needs: Not on file  Physical Activity: Not on file  Stress: Not on file  Social Connections: Not on file     Family History:  The patient's family history includes Colon polyps in his mother.  ROS:   Please see the history of present illness.  All other systems are reviewed and otherwise negative.    EKG(s)/Additional Labs   EKG:  EKG is not ordered today but reviewed from 04/29/21 showing NSR 75bpm nonspecific STTW changes  Recent Labs: 06/05/2020: ALT 27; TSH  1.145 04/19/2021: BUN 14; Creatinine, Ser 1.14; Hemoglobin 14.8; Magnesium 2.2; Platelets 256; Potassium 3.3; Sodium 138  Recent Lipid Panel    Component Value Date/Time   CHOL (H) 12/06/2009 0508    219        ATP III CLASSIFICATION:  <200     mg/dL   Desirable  867-672  mg/dL   Borderline High  >=094    mg/dL   High          TRIG 709 12/06/2009 0508   HDL 58 12/06/2009 0508   CHOLHDL 3.8 12/06/2009 0508   VLDL 28 12/06/2009 0508   LDLCALC (H) 12/06/2009 0508    133        Total Cholesterol/HDL:CHD Risk Coronary Heart Disease Risk Table                     Men   Women  1/2 Average Risk   3.4   3.3  Average Risk       5.0   4.4  2 X Average Risk   9.6   7.1  3 X Average Risk  23.4   11.0        Use the calculated Patient Ratio above and the CHD Risk Table to determine the patient's CHD Risk.        ATP III CLASSIFICATION (LDL):  <100     mg/dL   Optimal  628-366  mg/dL   Near or Above                    Optimal  130-159  mg/dL   Borderline  294-765  mg/dL   High  >465     mg/dL   Very High    PHYSICAL EXAM:    VS:  BP 116/72    Pulse 77    Ht 5\' 9"  (1.753 m)    Wt 221 lb 6.4 oz (100.4 kg)    SpO2 98%    BMI 32.70 kg/m   BMI: Body mass index is 32.7 kg/m.  GEN: Well nourished, well developed male in no acute distress HEENT: normocephalic, atraumatic Neck: no JVD, carotid bruits, or masses Cardiac: RRR; no murmurs, rubs, or gallops, no edema  Respiratory:  clear to auscultation bilaterally, normal work of breathing  GI: soft, nontender, nondistended, + BS MS: no deformity or atrophy Skin: warm and dry, no rash Neuro:  Alert and Oriented x 3, Strength and sensation are intact, follows commands Psych: euthymic mood, full affect  Wt Readings from Last 3 Encounters:  05/10/21 221 lb 6.4 oz (100.4 kg)  04/22/21 220 lb 6.4 oz (100 kg)  09/29/20 207 lb (93.9 kg)     ASSESSMENT & PLAN:   1. Paroxysmal atrial fibrillation/flutter - primarily managed by Afib clinic at  this juncture with improvement in symptoms s/p initiation of Multaq. Query relationship to recent flare in asthma +/- being off CPAP. He is not on anticoagulation due to h/o CHADSVASC of zero. He was noted to be hyperglycemic on last labs. Will obtain A1c as below. Will also update thyroid given recent recurrence.  2. Hypokalemia - K 3.3 on last labs. He's been taking an OTC potassium supplement but does not recall the dose. Will recheck BMET today. Recent Mg wnl. No obvious precipitants.  3. Mild dilation of aortic root - discussed incidental finding with patient, will recheck echo after 06/05/21.  4. Hyperglycemia - obtain A1C for baseline to exclude DM or pre-DM.  5. OSA, also hx of asthma - discussed relationship of underlying physiologic stressors to atrial fibrillation. Discussed importance of controlling underlying risk factors like his OSA and asthma contributing to increase in arrhythmia flares. Encouraged close f/u at the [facility] and avoidance of triggers where possible.    Disposition: Since atrial fib is primarily managing his afib/flutter at this time, recommend 1 year f/u with our office. [facility]   Medication Adjustments/Labs and Tests Ordered: Current medicines are reviewed at length with the patient today.  Concerns regarding medicines are outlined above. Medication changes, Labs and Tests ordered today are summarized above and listed in the Patient Instructions accessible in Encounters.    Signed, Laurann Montana, PA-C  05/10/2021 3:15 PM    [facility] - [place] Location in [facility] [address] [place], [place] [address] Ph: [phone]; Fax [phone]

## 2021-04-29 ENCOUNTER — Other Ambulatory Visit: Payer: Self-pay

## 2021-04-29 ENCOUNTER — Ambulatory Visit (HOSPITAL_COMMUNITY)
Admission: RE | Admit: 2021-04-29 | Discharge: 2021-04-29 | Disposition: A | Discharge status: Home / Self Care | Payer: Federal, State, Local not specified - PPO | Source: Ambulatory Visit | Attending: Nurse Practitioner | Admitting: Nurse Practitioner

## 2021-04-29 VITALS — BP 144/66 | HR 75

## 2021-04-29 DIAGNOSIS — I4891 Unspecified atrial fibrillation: Secondary | ICD-10-CM | POA: Insufficient documentation

## 2021-04-29 DIAGNOSIS — D6869 Other thrombophilia: Secondary | ICD-10-CM

## 2021-04-29 DIAGNOSIS — I480 Paroxysmal atrial fibrillation: Secondary | ICD-10-CM

## 2021-04-29 NOTE — Progress Notes (Signed)
In for EKG with start of Multaq 400 mg bid. He was having multiple episodes of afib a day. Since on Multaq, he has not noted any afib. He feels well. He has reduced cardizem to 120 mg daily. I will see back in one month.   Vent. rate 75 BPM PR interval 136 ms QRS duration 94 ms QT/QTcB 394/439 ms P-R-T axes 87 88 49 Normal sinus rhythm Nonspecific ST abnormality Abnormal ECG When compared with ECG of 22-Apr-2021 13:43,

## 2021-05-10 ENCOUNTER — Ambulatory Visit (INDEPENDENT_AMBULATORY_CARE_PROVIDER_SITE_OTHER): Payer: Federal, State, Local not specified - PPO | Admitting: Physician Assistant

## 2021-05-10 ENCOUNTER — Other Ambulatory Visit: Payer: Self-pay

## 2021-05-10 ENCOUNTER — Encounter: Payer: Self-pay | Admitting: Physician Assistant

## 2021-05-10 VITALS — BP 116/72 | HR 77 | Ht 69.0 in | Wt 221.4 lb

## 2021-05-10 DIAGNOSIS — E876 Hypokalemia: Secondary | ICD-10-CM | POA: Diagnosis not present

## 2021-05-10 DIAGNOSIS — G4733 Obstructive sleep apnea (adult) (pediatric): Secondary | ICD-10-CM

## 2021-05-10 DIAGNOSIS — I77810 Thoracic aortic ectasia: Secondary | ICD-10-CM

## 2021-05-10 DIAGNOSIS — I480 Paroxysmal atrial fibrillation: Secondary | ICD-10-CM | POA: Diagnosis not present

## 2021-05-10 DIAGNOSIS — R739 Hyperglycemia, unspecified: Secondary | ICD-10-CM

## 2021-05-10 DIAGNOSIS — I4892 Unspecified atrial flutter: Secondary | ICD-10-CM

## 2021-05-10 NOTE — Patient Instructions (Signed)
Medication Instructions:  Your physician recommends that you continue on your current medications as directed. Please refer to the Current Medication list given to you today.  *If you need a refill on your cardiac medications before your next appointment, please call your pharmacy*   Lab Work: Your physician recommends that you return for lab work in: Today   If you have labs (blood work) drawn today and your tests are completely normal, you will receive your results only by: MyChart Message (if you have MyChart) OR A paper copy in the mail If you have any lab test that is abnormal or we need to change your treatment, we will call you to review the results.   Testing/Procedures: Your physician has requested that you have an echocardiogram. Echocardiography is a painless test that uses sound waves to create images of your heart. It provides your doctor with information about the size and shape of your heart and how well your hearts chambers and valves are working. This procedure takes approximately one hour. There are no restrictions for this procedure.    Follow-Up: At [facility], you and your health needs are our priority.  As part of our continuing mission to provide you with exceptional heart care, we have created designated Provider Care Teams.  These Care Teams include your primary Cardiologist (physician) and Advanced Practice Providers (APPs -  Physician Assistants and Nurse Practitioners) who all work together to provide you with the care you need, when you need it.  We recommend signing up for the patient portal called "MyChart".  Sign up information is provided on this After Visit Summary.  MyChart is used to connect with patients for Virtual Visits (Telemedicine).  Patients are able to view lab/test results, encounter notes, upcoming appointments, etc.  Non-urgent messages can be sent to your provider as well.   To learn more about what you can do with MyChart, go to  [email or web address].    Your next appointment:   1 year(s)  The format for your next appointment:   In Person  Provider:   Nona Dell, MD    Other Instructions Thank you for choosing [facility]!

## 2021-05-11 ENCOUNTER — Other Ambulatory Visit (HOSPITAL_COMMUNITY)
Admission: RE | Admit: 2021-05-11 | Discharge: 2021-05-11 | Disposition: A | Discharge status: Home / Self Care | Payer: Federal, State, Local not specified - PPO | Source: Ambulatory Visit | Attending: Physician Assistant | Admitting: Physician Assistant

## 2021-05-11 DIAGNOSIS — R739 Hyperglycemia, unspecified: Secondary | ICD-10-CM | POA: Diagnosis not present

## 2021-05-11 DIAGNOSIS — I480 Paroxysmal atrial fibrillation: Secondary | ICD-10-CM | POA: Insufficient documentation

## 2021-05-11 LAB — BASIC METABOLIC PANEL
Anion gap: 12 (ref 5–15)
BUN: 12 mg/dL (ref 6–20)
CO2: 20 mmol/L — ABNORMAL LOW (ref 22–32)
Calcium: 9.1 mg/dL (ref 8.9–10.3)
Chloride: 104 mmol/L (ref 98–111)
Creatinine, Ser: 1.26 mg/dL — ABNORMAL HIGH (ref 0.61–1.24)
GFR, Estimated: >60 mL/min (ref >60)
Glucose, Bld: 92 mg/dL (ref 70–99)
Potassium: 4.1 mmol/L (ref 3.5–5.1)
Sodium: 136 mmol/L (ref 135–145)

## 2021-05-11 LAB — HEMOGLOBIN A1C
Hgb A1c MFr Bld: 5 % (ref 4.8–5.6)
Mean Plasma Glucose: 96.8 mg/dL

## 2021-05-11 LAB — TSH: TSH: 1.364 u[IU]/mL (ref 0.350–4.500)

## 2021-05-12 ENCOUNTER — Telehealth: Payer: Self-pay

## 2021-05-12 NOTE — Telephone Encounter (Signed)
Patient notified and verbalized understanding. Pt stated that he is taking 99 mg tablets of K+ qd. Pt agreeable to limit NSAIDs. Pt had no other questions or concerns at this time.  ?

## 2021-05-12 NOTE — Telephone Encounter (Signed)
-----   Message from Laurann Montana, PA-C sent at 05/12/2021  7:46 AM EST ----- ?Please inform patient that labs look OK - potassium much improved so OK to continue OTC supplement, just need to clarify dose for MAR. A1c does not show diabetes and thyroid normal. However, kidney function number is up marginally from prior value - previously around 1.14-1.17 range, now 1.26. He is not on any specific rx meds that would cause this so recommend to increase fluid intake and f/u with primary care within next few weeks to trend further. Recommend to limit meds like ibuprofen, Advil, Motrin, naproxen, and Aleve due to risk of worsening kidney function (Tylenol is safer alternative).  ? ?I will route to Lupita Leash to share with her since Multaq mentions renal issues under adverse reactions for any additional input but I suspect this is largely similar to his prior values. ? ? ? ?

## 2021-05-26 ENCOUNTER — Ambulatory Visit (HOSPITAL_COMMUNITY): Payer: No Typology Code available for payment source | Admitting: Nurse Practitioner

## 2021-06-08 ENCOUNTER — Ambulatory Visit (HOSPITAL_COMMUNITY): Payer: No Typology Code available for payment source

## 2021-06-09 ENCOUNTER — Ambulatory Visit (HOSPITAL_COMMUNITY)
Admission: RE | Admit: 2021-06-09 | Discharge: 2021-06-09 | Disposition: A | Discharge status: Home / Self Care | Payer: Federal, State, Local not specified - PPO | Source: Ambulatory Visit | Attending: Nurse Practitioner | Admitting: Nurse Practitioner

## 2021-06-09 VITALS — BP 106/66 | HR 69 | Ht 69.0 in | Wt 220.0 lb

## 2021-06-09 DIAGNOSIS — I480 Paroxysmal atrial fibrillation: Secondary | ICD-10-CM | POA: Insufficient documentation

## 2021-06-09 DIAGNOSIS — Z79899 Other long term (current) drug therapy: Secondary | ICD-10-CM | POA: Insufficient documentation

## 2021-06-09 DIAGNOSIS — K219 Gastro-esophageal reflux disease without esophagitis: Secondary | ICD-10-CM | POA: Insufficient documentation

## 2021-06-09 LAB — HEPATIC FUNCTION PANEL
ALT: 40 U/L (ref 0–44)
AST: 27 U/L (ref 15–41)
Albumin: 4.2 g/dL (ref 3.5–5.0)
Alkaline Phosphatase: 63 U/L (ref 38–126)
Bilirubin, Direct: <0.1 mg/dL (ref 0.0–0.2)
Total Bilirubin: 0.7 mg/dL (ref 0.3–1.2)
Total Protein: 7.4 g/dL (ref 6.5–8.1)

## 2021-06-09 NOTE — Progress Notes (Signed)
? ?Primary Care Physician: [facility] ?Referring Physician: [facility] ?Cardiologist: Dr. Diona Browner  ? ? ?Robert Gordon is a 56 y.o. male with a h/o GERD,  paroxysmal afib that was seen in the ED, not on anticoagulation with a CHA2DS2VASc  score of 0. He was seen recently in the ED with palpitations. He was noted to go in and out of afib with v rates up to 150's. He was also found to be hypokalemic with a K+ at 3.3. This was replaced. He was increased on Cardizem to  240 mg. He was asked to f/u with the afib clinic.  ? ?EKG today shows  NSR at 71 bpm. He states that he has afib every day. Sometimes during the day, but most often at night which interferes with his sleep.  Increase of Cardizem did not lessen his afib burden.  ? ?F/u  in afib clinic, 06/08/21, he was started on Multaq with great control of his afib. No afib to report today. ? ? Today, he denies symptoms of palpitations, chest pain, shortness of breath, orthopnea, PND, lower extremity edema, dizziness, presyncope, syncope, or neurologic sequela. The patient is tolerating medications without difficulties and is otherwise without complaint today.  ? ?Past Medical History:  ?Diagnosis Date  ? Arthritis   ? Asthma   ? Avascular necrosis of bones of both hips (HCC)   ? Diverticulitis   ? s/p sigmoid colectomy  ? GERD (gastroesophageal reflux disease)   ? Hyperlipidemia   ? IBS (irritable bowel syndrome)   ? Paroxysmal atrial fibrillation (HCC)   ? Diagnosed March 2022  ? ?Past Surgical History:  ?Procedure Laterality Date  ? COLONOSCOPY N/A 10/02/2012  ? Procedure: COLONOSCOPY;  Surgeon: West Bali, MD;  Location[facility];  Service: Endoscopy;  Laterality: N/A;  11:00  ? HIP SURGERY Left   ? LAPAROSCOPIC SIGMOID COLECTOMY  09/11/2006  ? SIGMOIDOSCOPY  08/14/2006  ? [id number] diverticula seen beginning 20 cm from the anal verge and extending to 50 cm from the anal verge/Normal retroflexed view of the rectum.  ? TONSILLECTOMY     ? ? ?Current Outpatient Medications  ?Medication Sig Dispense Refill  ? albuterol (PROVENTIL) (2.5 MG/3ML) 0.083% nebulizer solution Take 2.5 mg by nebulization every 4 (four) hours as needed for shortness of breath. For shortness of breath    ? albuterol (VENTOLIN HFA) 108 (90 Base) MCG/ACT inhaler Inhale 1 puff into the lungs every 6 (six) hours as needed for wheezing or shortness of breath.    ? atorvastatin (LIPITOR) 40 MG tablet Take 40 mg by mouth at bedtime.    ? buPROPion ER (WELLBUTRIN SR) 100 MG 12 hr tablet Take 1 tablet by mouth daily.    ? Cholecalciferol 50 MCG (2000 UT) TABS Take 4,000 Units by mouth daily.    ? diltiazem (CARDIZEM CD) 120 MG 24 hr capsule Take 1 capsule (120 mg total) by mouth daily. 30 capsule 3  ? diltiazem (CARDIZEM) 30 MG tablet Take 1 tablet (30 mg total) by mouth as needed. For palpitations.  Take 1 tablet if palpitations do not stop and 1 hour.  Take second tablet.  If they do not stop after that seek medical attention. 30 tablet 6  ? dronedarone (MULTAQ) 400 MG tablet Take 1 tablet (400 mg total) by mouth 2 (two) times daily with a meal. 60 tablet 3  ? Fluticasone-Salmeterol (WIXELA INHUB IN) Inhale 2 puffs into the lungs in the morning and at bedtime.    ?  gabapentin (NEURONTIN) 100 MG capsule TAKE ONE CAPSULE BY MOUTH EVERY DAY AS NEEDED MAY INCREASE TO 3 CAPSULES (300MG ) IF NEEDED AS DIRECTED    ? ipratropium (ATROVENT) 0.02 % nebulizer solution Take 500 mcg by nebulization every 4 (four) hours as needed. For shortness of breath    ? loratadine (CLARITIN) 10 MG tablet Take 10 mg by mouth daily.    ? MELATONIN GUMMIES PO Taking 2 gummies by mouth at bedtime- Total 40mg     ? Omega-3 Fatty Acids (FISH OIL PO) Take 2 tablets by mouth every morning. Fish oil with vitamin D3- 120mg  total    ? Potassium 99 MG TABS Take 1 tablet by mouth every morning.    ? predniSONE (DELTASONE) 20 MG tablet Take 2 tablets (40 mg total) by mouth daily. (Patient taking differently: Take 40 mg by  mouth as needed.) 10 tablet 0  ? ?No current facility-administered medications for this encounter.  ? ? ?Allergies  ?Allergen Reactions  ? Dextromethorphan Other (See Comments)  ?  Thinks it may be a causative agent in the asthma attacks, unsure. Had an attack one time after taking cough medicine containing dextromethorphan.   ? Dm [Dextromethorphan-Guaifenesin] Other (See Comments)  ?  Hurts chest.  ? Lactaid [Tilactase] Diarrhea, Nausea And Vomiting and Other (See Comments)  ?  Abdominal cramping/bloating  ? ? ?Social History  ? ?Socioeconomic History  ? Marital status: Married  ?  Spouse name: Not on file  ? Number of children: Not on file  ? Years of education: Not on file  ? Highest education level: Not on file  ?Occupational History  ? Occupation: [employer]  ?  Employer: [employer]  ?Tobacco Use  ? Smoking status: Some Days  ?  Packs/day: 0.50  ?  Types: Cigarettes  ? Smokeless tobacco: Never  ?Vaping Use  ? Vaping Use: Never used  ?Substance and Sexual Activity  ? Alcohol use: No  ? Drug use: No  ? Sexual activity: Not on file  ?Other Topics Concern  ? Not on file  ?Social History Narrative  ? Not on file  ? ?Social Determinants of Health  ? ?Financial Resource Strain: Not on file  ?Food Insecurity: Not on file  ?Transportation Needs: Not on file  ?Physical Activity: Not on file  ?Stress: Not on file  ?Social Connections: Not on file  ?Intimate Partner Violence: Not on file  ? ? ?Family History  ?Problem Relation Age of Onset  ? Colon polyps Mother   ?     pre-cancerous  ? ? ?ROS- All systems are reviewed and negative except as per the HPI above ? ?Physical Exam: ?Vitals:  ? 06/09/21 1347  ?BP: 106/66  ?Pulse: 69  ?Weight: 99.8 kg  ?Height: 5\' 9"  (1.753 m)  ? ?Wt Readings from Last 3 Encounters:  ?06/09/21 99.8 kg  ?05/10/21 100.4 kg  ?04/22/21 100 kg  ? ? ?Labs: ?Lab Results  ?Component Value Date  ? NA 136 05/11/2021  ? K 4.1 05/11/2021  ? CL 104 05/11/2021  ? CO2 20 (L)  05/11/2021  ? GLUCOSE 92 05/11/2021  ? BUN 12 05/11/2021  ? CREATININE 1.26 (H) 05/11/2021  ? CALCIUM 9.1 05/11/2021  ? PHOS 2.6 12/02/2009  ? MG 2.2 04/19/2021  ? ?Lab Results  ?Component Value Date  ? INR 1.00 12/01/2009  ? ?Lab Results  ?Component Value Date  ? CHOL (H) 12/06/2009  ?  219        ?ATP III  CLASSIFICATION: ? <200     mg/dL   Desirable ? 427-062  mg/dL   Borderline High ? >=376    mg/dL   High ?        ? HDL 58 12/06/2009  ? LDLCALC (H) 12/06/2009  ?  133        ?Total Cholesterol/HDL:CHD Risk ?Coronary Heart Disease Risk Table ?                    Men   Women ? 1/2 Average Risk   3.4   3.3 ? Average Risk       5.0   4.4 ? 2 X Average Risk   9.6   7.1 ? 3 X Average Risk  23.4   11.0 ?       ?Use the calculated Patient Ratio ?above and the CHD Risk Table ?to determine the patient's CHD Risk. ?       ?ATP III CLASSIFICATION (LDL): ? <100     mg/dL   Optimal ? 283-151  mg/dL   Near or Above ?                   Optimal ? 130-159  mg/dL   Borderline ? 160-189  mg/dL   High ? >761     mg/dL   Very High  ? TRIG 140 12/06/2009  ? ? ? ?GEN- The patient is well appearing, alert and oriented x 3 today.   ?Head- normocephalic, atraumatic ?Eyes-  Sclera clear, conjunctiva pink ?Ears- hearing intact ?Oropharynx- clear ?Neck- supple, no JVP ?Lymph- no cervical lymphadenopathy ?Lungs- Clear to ausculation bilaterally, normal work of breathing ?Heart- Regular rate and rhythm, no murmurs, rubs or gallops, PMI not laterally displaced ?GI- soft, NT, ND, + BS ?Extremities- no clubbing, cyanosis, or edema ?MS- no significant deformity or atrophy ?Skin- no rash or lesion ?Psych- euthymic mood, full affect ?Neuro- strength and sensation are intact ? ?EKG- Vent. rate 69 BPM ?PR interval 138 ms ?QRS duration 94 ms ?QT/QTcB 404/432 ms ?P-R-T axes 89 82 77 ?Normal sinus rhythm ?Normal ECG ?When compared with ECG of 29-Apr-2021 15:11, ?PREVIOUS ECG IS PRESENT ? ? ? ?Assessment and Plan:  ?1. Afib ?Continue Multaq 400 mg  bid ?Afib is so far quiet with addition of this drug  ?Continue Cardizem 120 mg daily ?In the past we had discussed ablation but he is happy with current approach  ?Hepatic panel today with recent  addition of Multaq ?E

## 2021-06-23 ENCOUNTER — Ambulatory Visit (HOSPITAL_COMMUNITY)
Admission: RE | Admit: 2021-06-23 | Discharge: 2021-06-23 | Disposition: A | Discharge status: Home / Self Care | Payer: Federal, State, Local not specified - PPO | Source: Ambulatory Visit | Attending: Physician Assistant | Admitting: Physician Assistant

## 2021-06-23 ENCOUNTER — Other Ambulatory Visit (HOSPITAL_COMMUNITY): Payer: Federal, State, Local not specified - PPO

## 2021-06-23 DIAGNOSIS — I77810 Thoracic aortic ectasia: Secondary | ICD-10-CM | POA: Insufficient documentation

## 2021-06-23 DIAGNOSIS — I4891 Unspecified atrial fibrillation: Secondary | ICD-10-CM

## 2021-06-23 LAB — ECHOCARDIOGRAM COMPLETE
Area-P 1/2: 3.03 cm2
S' Lateral: 2.2 cm

## 2021-06-23 NOTE — Progress Notes (Signed)
*  PRELIMINARY RESULTS* ?Echocardiogram ?2D Echocardiogram has been performed. ? ?Stacey Drain ?06/23/2021, 3:03 PM ?

## 2021-08-03 ENCOUNTER — Other Ambulatory Visit (HOSPITAL_COMMUNITY): Payer: Self-pay

## 2021-08-03 MED ORDER — MULTAQ 400 MG PO TABS: 400.0000 mg | ORAL_TABLET | Freq: Two times a day (BID) | ORAL | 3 refills | Status: DC

## 2021-11-26 ENCOUNTER — Emergency Department (HOSPITAL_COMMUNITY)
Admission: EM | Admit: 2021-11-26 | Discharge: 2021-11-26 | Disposition: A | Discharge status: Home / Self Care | Payer: No Typology Code available for payment source | Attending: Emergency Medicine | Admitting: Emergency Medicine

## 2021-11-26 ENCOUNTER — Encounter (HOSPITAL_COMMUNITY): Payer: Self-pay | Admitting: Emergency Medicine

## 2021-11-26 DIAGNOSIS — J4541 Moderate persistent asthma with (acute) exacerbation: Secondary | ICD-10-CM | POA: Diagnosis not present

## 2021-11-26 DIAGNOSIS — R0602 Shortness of breath: Secondary | ICD-10-CM | POA: Diagnosis present

## 2021-11-26 DIAGNOSIS — Z7951 Long term (current) use of inhaled steroids: Secondary | ICD-10-CM | POA: Diagnosis not present

## 2021-11-26 MED ORDER — IPRATROPIUM-ALBUTEROL 0.5-2.5 (3) MG/3ML IN SOLN
3.0000 mL | Freq: Once | RESPIRATORY_TRACT | Status: AC
  Administered 2021-11-26: 3 mL via RESPIRATORY_TRACT
  Filled 2021-11-26: qty 3

## 2021-11-26 MED ORDER — PREDNISONE 20 MG PO TABS
40.0000 mg | ORAL_TABLET | Freq: Once | ORAL | Status: AC
  Administered 2021-11-26: 40 mg via ORAL
  Filled 2021-11-26: qty 2

## 2021-11-26 MED ORDER — PREDNISONE 20 MG PO TABS: ORAL_TABLET | ORAL | 0 refills | Status: DC

## 2021-11-26 NOTE — Discharge Instructions (Signed)
Start the prednisone tomorrow as you were given the first dose today.  You should also consider starting something such as Claritin or Zyrtec for allergies.    If you are needing your inhaler or nebulizer more than every 4 hours or your shortness of breath or wheezing or cough gets worse or develop any other new/concerning symptoms and return to the ER.

## 2021-11-26 NOTE — ED Provider Notes (Signed)
[facility] Provider Note   CSN: [id number] Arrival date & time: 11/26/21  0737     History  Chief Complaint  Patient presents with   Asthma    [facility] is a 56 y.o. male.  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  States that pretty much every morning he will wake up and have to do his inhaler and sometimes a nebulizer due to his asthma.  He states that his house has not been cleaned very well and there is a lot of dust and pet hair which she thinks is causing a lot of the problems.  However this morning it was a little worse than typical and after the albuterol inhaler he had to use a nebulizer and then use a nebulizer again.  He has prednisone that he was given and he took 2 tablets totaling up to 20 mg this morning.  He states he rarely takes the prednisone.  He has a cough at night from the congestion but no cough during the day.  He states he occasionally smokes and has smoked a couple cigarettes over the last few days due to his stress.  He denies any chest pain though his chest feels a little tight which is typical of his asthma.  He feels like the steroids are kicking in while he is been in the waiting room and he is actually feeling fairly better though still feels a little tight in his chest.  Home Medications Prior to Admission medications   Medication Sig Start Date End Date Taking? Authorizing Provider  albuterol (PROVENTIL) (2.5 MG/3ML) 0.083% nebulizer solution Take 2.5 mg by nebulization every 4 (four) hours as needed for shortness of breath. For shortness of breath   Yes [provider]  albuterol (VENTOLIN HFA) 108 (90 Base) MCG/ACT inhaler Inhale 1 puff into the lungs every 6 (six) hours as needed for wheezing or shortness of breath.   Yes [provider]  atorvastatin (LIPITOR) 40 MG tablet Take 20 mg by  mouth at bedtime.   Yes [provider]  buPROPion ER (WELLBUTRIN SR) 100 MG 12 hr tablet Take 1 tablet by mouth daily. 07/23/20  Yes [provider]  Cholecalciferol 50 MCG (2000 UT) TABS Take 4,000 Units by mouth daily. 07/19/19  Yes [provider]  diltiazem (TIAZAC) 120 MG 24 hr capsule Take 120 mg by mouth daily. 11/19/21  Yes [provider]  dronedarone (MULTAQ) 400 MG tablet Take 1 tablet (400 mg total) by mouth 2 (two) times daily with a meal. 08/03/21  Yes Newman Nip, NP  Fluticasone-Salmeterol (WIXELA INHUB IN) Inhale 2 puffs into the lungs in the morning and at bedtime.   Yes [provider]  gabapentin (NEURONTIN) 100 MG capsule Take 100 mg by mouth daily as needed (pain). 07/23/20  Yes [provider]  Omega-3 Fatty Acids (FISH OIL PO) Take 2 tablets by mouth every morning. Fish oil with vitamin D3- 120mg  total   Yes [provider]  predniSONE (DELTASONE) 20 MG tablet 2 tabs po daily x 4 days 11/26/21  Yes 11/28/21, MD  diltiazem (CARDIZEM CD) 120 MG 24 hr capsule Take 1 capsule (120 mg total) by mouth daily. Patient not taking: Reported on 11/26/2021 04/22/21   06/20/21, NP  diltiazem (CARDIZEM) 30 MG tablet Take 1 tablet (30 mg total) by mouth as needed.  For palpitations.  Take 1 tablet if palpitations do not stop and 1 hour.  Take second tablet.  If they do not stop after that seek medical attention. 03/24/21   Jonelle Sidle, MD  Potassium 99 MG TABS Take 1 tablet by mouth every morning. Patient not taking: Reported on 11/26/2021    [provider]      Allergies    Dextromethorphan, Dm [dextromethorphan-guaifenesin], and Lactaid [tilactase]    Review of Systems   Review of Systems  HENT:  Negative for sore throat.   Respiratory:  Positive for chest tightness, shortness of breath and wheezing.   Cardiovascular:  Negative for chest pain.    Physical Exam Updated Vital Signs BP 131/85    Pulse 89   Temp 97.9 F (36.6 C) (Oral)   Resp 19   Ht 5\' 9"  (1.753 m)   Wt 99.8 kg   SpO2 100%   BMI 32.49 kg/m  Physical Exam Vitals and nursing note reviewed.  Constitutional:      General: He is not in acute distress.    Appearance: He is well-developed. He is not ill-appearing or diaphoretic.  HENT:     Head: Normocephalic and atraumatic.  Cardiovascular:     Rate and Rhythm: Normal rate and regular rhythm.     Heart sounds: Normal heart sounds.  Pulmonary:     Effort: Pulmonary effort is normal.     Breath sounds: Wheezing (diffuse, mild expiratory) present.  Abdominal:     General: There is no distension.  Skin:    General: Skin is warm and dry.  Neurological:     Mental Status: He is alert.     ED Results / Procedures / Treatments   Labs (all labs ordered are listed, but only abnormal results are displayed) Labs Reviewed - No data to display  EKG None  Radiology No results found.  Procedures Procedures    Medications Ordered in ED Medications  predniSONE (DELTASONE) tablet 40 mg (40 mg Oral Given 11/26/21 0904)  ipratropium-albuterol (DUONEB) 0.5-2.5 (3) MG/3ML nebulizer solution 3 mL (3 mLs Nebulization Given 11/26/21 0944)    ED Course/ Medical Decision Making/ A&P                           Medical Decision Making Amount and/or Complexity of Data Reviewed External Data Reviewed: notes.  Risk Prescription drug management.   Patient was given a DuoNeb here and his wheezing has resolved.  Symptoms have resolved as well.  Seems like he has daily asthma symptoms that were worse this morning.  I think is reasonable to treat with a burst of steroids.  He was given 40 mg prednisone here to complete 60 mg combined with what he took this morning.  Otherwise we will give a 4-day burst and have him use his albuterol at home.  Also recommend trying something such as Zyrtec or Claritin. My suspicion for something such as pneumonia is pretty low.  I do not  think x-ray is necessarily warranted.  Vital signs are currently normal.  Will discharge home with return precautions and follow-up with the 11/28/21.        Final Clinical Impression(s) / ED Diagnoses Final diagnoses:  Moderate persistent asthma with exacerbation    Rx / DC Orders ED Discharge Orders          Ordered    predniSONE (DELTASONE) 20 MG tablet        11/26/21  1009              Pricilla Loveless, MD 11/26/21 1014

## 2021-11-26 NOTE — ED Triage Notes (Signed)
Pt arrives c/o SOB that started at 0400. Pt took 2 neb treatments and 2 prednisone tabs at home PTA. Pt says that it has improved since taking his home meds.

## 2021-12-08 NOTE — Progress Notes (Deleted)
Cardiology Office Note    Date:  12/08/2021   ID:  [facility], DOB [birth year], MRN [id number]  PCP:  [facility]  Cardiologist: Nona Dell, MD    No chief complaint on file.   History of Present Illness:    [facility] is a 56 y.o. male with past medical history of paroxysmal atrial fibrillation/flutter, HLD, IBS, OSA, asthma, arthritis and AVN of the hip joints who presents to the office today for 28-month follow-up.   He was examined by Ronie Spies, PA-C in 04/2021 and had recently been started on Multaq by the [facility] and reported his palpitations had significantly decreased in frequency. Was not on anticoagulation given his CHA2DS2-VASc Score of 0. A follow-up echocardiogram was recommended given his previously dilated aortic root and this was normal at 3.6 cm. He did see Rudi Coco, NP in 05/2021 and was continued on Multaq 400mg  BID and Cardizem CD 120mg  daily. Ablation was discussed but he wished to continue with medical therapy.     Past Medical History:  Diagnosis Date   Arthritis    Asthma    Avascular necrosis of bones of both hips (HCC)    Diverticulitis    s/p sigmoid colectomy   GERD (gastroesophageal reflux disease)    Hyperlipidemia    IBS (irritable bowel syndrome)    Paroxysmal atrial fibrillation [facility])    Diagnosed March 2022    Past Surgical History:  Procedure Laterality Date   COLONOSCOPY N/A 10/02/2012   Procedure: COLONOSCOPY;  Surgeon: April 2022, MD;  Location: [facility];  Service: Endoscopy;  Laterality: N/A;  11:00   HIP SURGERY Left    LAPAROSCOPIC SIGMOID COLECTOMY  09/11/2006   SIGMOIDOSCOPY  08/14/2006   09/13/2006 diverticula seen beginning 20 cm from the anal verge and extending to 50 cm from the anal verge/Normal retroflexed view of the rectum.   TONSILLECTOMY      Current Medications: Outpatient Medications Prior to Visit  Medication Sig Dispense Refill   albuterol  (PROVENTIL) (2.5 MG/3ML) 0.083% nebulizer solution Take 2.5 mg by nebulization every 4 (four) hours as needed for shortness of breath. For shortness of breath     albuterol (VENTOLIN HFA) 108 (90 Base) MCG/ACT inhaler Inhale 1 puff into the lungs every 6 (six) hours as needed for wheezing or shortness of breath.     atorvastatin (LIPITOR) 40 MG tablet Take 20 mg by mouth at bedtime.     buPROPion ER (WELLBUTRIN SR) 100 MG 12 hr tablet Take 1 tablet by mouth daily.     Cholecalciferol 50 MCG (2000 UT) TABS Take 4,000 Units by mouth daily.     diltiazem (CARDIZEM CD) 120 MG 24 hr capsule Take 1 capsule (120 mg total) by mouth daily. (Patient not taking: Reported on 11/26/2021) 30 capsule 3   diltiazem (CARDIZEM) 30 MG tablet Take 1 tablet (30 mg total) by mouth as needed. For palpitations.  Take 1 tablet if palpitations do not stop and 1 hour.  Take second tablet.  If they do not stop after that seek medical attention. 30 tablet 6   diltiazem (TIAZAC) 120 MG 24 hr capsule Take 120 mg by mouth daily.     dronedarone (MULTAQ) 400 MG tablet Take 1 tablet (400 mg total) by mouth 2 (two) times daily with a meal. 60 tablet 3   Fluticasone-Salmeterol (WIXELA INHUB IN) Inhale 2 puffs into the lungs in the morning and at bedtime.  gabapentin (NEURONTIN) 100 MG capsule Take 100 mg by mouth daily as needed (pain).     Omega-3 Fatty Acids (FISH OIL PO) Take 2 tablets by mouth every morning. Fish oil with vitamin D3- 120mg  total     Potassium 99 MG TABS Take 1 tablet by mouth every morning. (Patient not taking: Reported on 11/26/2021)     predniSONE (DELTASONE) 20 MG tablet 2 tabs po daily x 4 days 8 tablet 0   No facility-administered medications prior to visit.     Allergies:   Dextromethorphan, Dm [dextromethorphan-guaifenesin], and Lactaid [tilactase]   Social History   Socioeconomic History   Marital status: Married    Spouse name: Not on file   Number of children: Not on file   Years of education:  Not on file   Highest education level: Not on file  Occupational History   Occupation: Theatre stage manager: [employer]  Tobacco Use   Smoking status: Some Days    Packs/day: 0.50    Types: Cigarettes   Smokeless tobacco: Never  Vaping Use   Vaping Use: Never used  Substance and Sexual Activity   Alcohol use: No   Drug use: No   Sexual activity: Not on file  Other Topics Concern   Not on file  Social History Narrative   Not on file   Social Determinants of Health   Financial Resource Strain: Not on file  Food Insecurity: Not on file  Transportation Needs: Not on file  Physical Activity: Not on file  Stress: Not on file  Social Connections: Not on file     Family History:  The patient's ***family history includes Colon polyps in his mother.   Review of Systems:    Please see the history of present illness.     All other systems reviewed and are otherwise negative except as noted above.   Physical Exam:    VS:  There were no vitals taken for this visit.   General: Well developed, well nourished,male appearing in no acute distress. Head: Normocephalic, atraumatic. Neck: No carotid bruits. JVD not elevated.  Lungs: Respirations regular and unlabored, without wheezes or rales.  Heart: ***Regular rate and rhythm. No S3 or S4.  No murmur, no rubs, or gallops appreciated. Abdomen: Appears non-distended. No obvious abdominal masses. Msk:  Strength and tone appear normal for age. No obvious joint deformities or effusions. Extremities: No clubbing or cyanosis. No edema.  Distal pedal pulses are 2+ bilaterally. Neuro: Alert and oriented X 3. Moves all extremities spontaneously. No focal deficits noted. Psych:  Responds to questions appropriately with a normal affect. Skin: No rashes or lesions noted  Wt Readings from Last 3 Encounters:  11/26/21 220 lb 0.3 oz (99.8 kg)  06/09/21 220 lb (99.8 kg)  05/10/21 221 lb 6.4 oz (100.4 kg)         Studies/Labs Reviewed:   EKG:  EKG is*** ordered today.  The ekg ordered today demonstrates ***  Recent Labs: 04/19/2021: Hemoglobin 14.8; Magnesium 2.2; Platelets 256 05/11/2021: BUN 12; Creatinine, Ser 1.26; Potassium 4.1; Sodium 136; TSH 1.364 06/09/2021: ALT 40   Lipid Panel    Component Value Date/Time   CHOL (H) 12/06/2009 0508    219        ATP III CLASSIFICATION:  <200     mg/dL   Desirable  200-239  mg/dL   Borderline High  >=240    mg/dL   High  TRIG 140 12/06/2009 0508   HDL 58 12/06/2009 0508   CHOLHDL 3.8 12/06/2009 0508   VLDL 28 12/06/2009 0508   LDLCALC (H) 12/06/2009 0508    133        Total Cholesterol/HDL:CHD Risk Coronary Heart Disease Risk Table                     Men   Women  1/2 Average Risk   3.4   3.3  Average Risk       5.0   4.4  2 X Average Risk   9.6   7.1  3 X Average Risk  23.4   11.0        Use the calculated Patient Ratio above and the CHD Risk Table to determine the patient's CHD Risk.        ATP III CLASSIFICATION (LDL):  <100     mg/dL   Optimal  681-275  mg/dL   Near or Above                    Optimal  130-159  mg/dL   Borderline  170-017  mg/dL   High  >494     mg/dL   Very High    Additional studies/ records that were reviewed today include:   Echocardiogram: 06/2021 IMPRESSIONS     1. Left ventricular ejection fraction, by estimation, is 55 to 60%. The  left ventricle has normal function. The left ventricle has no regional  wall motion abnormalities. Left ventricular diastolic parameters were  normal.   2. Right ventricular systolic function is normal. The right ventricular  size is normal. Tricuspid regurgitation signal is inadequate for assessing  PA pressure.   3. The mitral valve is normal in structure. No evidence of mitral valve  regurgitation. No evidence of mitral stenosis.   4. The aortic valve is tricuspid. Aortic valve regurgitation is not  visualized. No aortic stenosis is present.   5.  The inferior vena cava is normal in size with greater than 50%  respiratory variability, suggesting right atrial pressure of 3 mmHg.   6. Aortic root measures 3.6 cm. This is within normal limits for age,  gender, and BSA.   Assessment:    No diagnosis found.   Plan:   In order of problems listed above:  1. Paroxysmal Atrial Fibrillation/Flutter - ***  2. HLD - ***  3. OSA - ***   Shared Decision Making/Informed Consent:   {Are you ordering a CV Procedure (e.g. stress test, cath, DCCV, TEE, etc)?   Press F2        :[id number]}    Medication Adjustments/Labs and Tests Ordered: Current medicines are reviewed at length with the patient today.  Concerns regarding medicines are outlined above.  Medication changes, Labs and Tests ordered today are listed in the Patient Instructions below. There are no Patient Instructions on file for this visit.   Signed, Ellsworth Lennox, PA-C  12/08/2021 10:10 AM    [facility] [address] [place], [place] [address] Phone: [phone] Fax: [phone]

## 2021-12-10 ENCOUNTER — Ambulatory Visit: Payer: No Typology Code available for payment source | Admitting: Student

## 2021-12-16 ENCOUNTER — Ambulatory Visit
Admission: EM | Admit: 2021-12-16 | Discharge: 2021-12-16 | Disposition: A | Discharge status: Home / Self Care | Payer: No Typology Code available for payment source | Attending: Nurse Practitioner | Admitting: Nurse Practitioner

## 2021-12-16 ENCOUNTER — Ambulatory Visit (INDEPENDENT_AMBULATORY_CARE_PROVIDER_SITE_OTHER): Payer: No Typology Code available for payment source

## 2021-12-16 DIAGNOSIS — Z1152 Encounter for screening for COVID-19: Secondary | ICD-10-CM | POA: Diagnosis not present

## 2021-12-16 DIAGNOSIS — J4541 Moderate persistent asthma with (acute) exacerbation: Secondary | ICD-10-CM | POA: Diagnosis not present

## 2021-12-16 DIAGNOSIS — K529 Noninfective gastroenteritis and colitis, unspecified: Secondary | ICD-10-CM | POA: Insufficient documentation

## 2021-12-16 DIAGNOSIS — R0989 Other specified symptoms and signs involving the circulatory and respiratory systems: Secondary | ICD-10-CM | POA: Diagnosis not present

## 2021-12-16 DIAGNOSIS — R000 Tachycardia, unspecified: Secondary | ICD-10-CM | POA: Insufficient documentation

## 2021-12-16 MED ORDER — PREDNISONE 20 MG PO TABS: 40.0000 mg | ORAL_TABLET | Freq: Every day | ORAL | 0 refills | Status: AC

## 2021-12-16 MED ORDER — ONDANSETRON 4 MG PO TBDP
4.0000 mg | ORAL_TABLET | Freq: Once | ORAL | Status: AC
  Administered 2021-12-16: 4 mg via ORAL

## 2021-12-16 MED ORDER — PROMETHAZINE HCL 25 MG PO TABS: 25.0000 mg | ORAL_TABLET | Freq: Three times a day (TID) | ORAL | 0 refills | Status: AC | PRN

## 2021-12-16 NOTE — Discharge Instructions (Signed)
I am concerned about you.  Your heart rate is elevated and I think this is because of how much fluid you have lost from diarrhea. Please try to drink lots of fluid when you get home - sugar free electrolyte drinks or plain water.  You can take Phenergan for the nausea/vomiting.  If you are not able to keep fluids down, please go to the Emergency Room for IV fluids.   I am also concerned about your asthma, please start the prednisone tomorrow morning to treat inflammation in your lungs. The chest x-ray did not show pneumonia or any broken ribs.  You likely have a pulled muscle on your rib cage and this should improve over the next few weeks.  We have tested you for COVID-19 and will call you tomorrow if you test positive.    Some things that can make you feel better are: - Increased rest - Increasing fluid with water/sugar free electrolytes - Acetaminophen as needed for fever/pain  - OTC guaifenesin (Mucinex) - Saline sinus flushes or a neti pot - Humidifying the air

## 2021-12-16 NOTE — ED Provider Notes (Signed)
[facility]    CSN: [id number] Arrival date & time: 12/16/21  1019      History   Chief Complaint Chief Complaint  Patient presents with   Nausea   Chills        Diarrhea    HPI DANNA CASELLA is a 56 y.o. male.   Patient presents for cute onset of symptoms early this morning.  He endorses body aches, chills, dry heaving, nausea, and diarrhea.  Also endorses a cough every once in a while, runny nose, sneezing, headache, right ear pain yesterday, and fatigue.  He denies fever, chest pain or tightness, chest congestion, nasal congestion, sore throat, and new rash.  Denies any abdominal pain.  He has not eaten anything and has drank some water and was able to keep it down at home.  Reports his wife has been home sick with a stomach bug the past few days.  Reports he has had 6-7 episodes of diarrhea, denies blood in the diarrhea.  Reports the diarrhea is brown in color and there are some formed pieces, however it is starting to water.  He has tried Imodium and wife's prescription of Zofran prior to coming to urgent care with minimal relief.  Patient also reports he has been dealing with an exacerbation of his asthma for the past few weeks.  Reports he has been using albuterol more frequently than normal.  He took 2 prednisone last night that he had at home.    Past Medical History:  Diagnosis Date   Arthritis    Asthma    Avascular necrosis of bones of both hips (HCC)    Diverticulitis    s/p sigmoid colectomy   GERD (gastroesophageal reflux disease)    Hyperlipidemia    IBS (irritable bowel syndrome)    Paroxysmal atrial fibrillation [facility])    Diagnosed March 2022    Patient Active Problem List   Diagnosis Date Noted   Atrial fibrillation with RVR (HCC) 06/05/2020   LLQ pain 09/25/2012   Unspecified constipation 09/25/2012   ALLERGIC RHINITIS 11/28/2008   INTRINSIC ASTHMA, UNSPECIFIED 11/28/2008   GERD 11/28/2008   DIVERTICULITIS, COLON 11/28/2008     Past Surgical History:  Procedure Laterality Date   COLONOSCOPY N/A 10/02/2012   Procedure: COLONOSCOPY;  Surgeon: West Bali, MD;  Location: [facility];  Service: Endoscopy;  Laterality: N/A;  11:00   HIP SURGERY Left    LAPAROSCOPIC SIGMOID COLECTOMY  09/11/2006   SIGMOIDOSCOPY  08/14/2006   EYC:XKGYJEHU diverticula seen beginning 20 cm from the anal verge and extending to 50 cm from the anal verge/Normal retroflexed view of the rectum.   TONSILLECTOMY         Home Medications    Prior to Admission medications   Medication Sig Start Date End Date Taking? Authorizing Provider  predniSONE (DELTASONE) 20 MG tablet Take 2 tablets (40 mg total) by mouth daily with breakfast for 5 days. 12/16/21 12/21/21 Yes Valentino Nose, NP  promethazine (PHENERGAN) 25 MG tablet Take 1 tablet (25 mg total) by mouth every 8 (eight) hours as needed for nausea or vomiting. Do not take with alcohol or while driving or operating heavy machinery.  May cause drowsiness. 12/16/21  Yes Cathlean Marseilles A, NP  albuterol (PROVENTIL) (2.5 MG/3ML) 0.083% nebulizer solution Take 2.5 mg by nebulization every 4 (four) hours as needed for shortness of breath. For shortness of breath    [provider]  albuterol (VENTOLIN HFA) 108 (90 Base) MCG/ACT inhaler  Inhale 1 puff into the lungs every 6 (six) hours as needed for wheezing or shortness of breath.    [provider]  atorvastatin (LIPITOR) 40 MG tablet Take 20 mg by mouth at bedtime.    [provider]  buPROPion ER (WELLBUTRIN SR) 100 MG 12 hr tablet Take 1 tablet by mouth daily. 07/23/20   [provider]  Cholecalciferol 50 MCG (2000 UT) TABS Take 4,000 Units by mouth daily. 07/19/19   [provider]  diltiazem (CARDIZEM CD) 120 MG 24 hr capsule Take 1 capsule (120 mg total) by mouth daily. Patient not taking: Reported on 11/26/2021 04/22/21   Newman Nip, NP  diltiazem (CARDIZEM) 30 MG tablet Take 1 tablet  (30 mg total) by mouth as needed. For palpitations.  Take 1 tablet if palpitations do not stop and 1 hour.  Take second tablet.  If they do not stop after that seek medical attention. 03/24/21   Jonelle Sidle, MD  diltiazem [facility]) 120 MG 24 hr capsule Take 120 mg by mouth daily. 11/19/21   [provider]  dronedarone (MULTAQ) 400 MG tablet Take 1 tablet (400 mg total) by mouth 2 (two) times daily with a meal. 08/03/21   Newman Nip, NP  Fluticasone-Salmeterol [facility]) Inhale 2 puffs into the lungs in the morning and at bedtime.    [provider]  gabapentin (NEURONTIN) 100 MG capsule Take 100 mg by mouth daily as needed (pain). 07/23/20   [provider]  Omega-3 Fatty Acids (FISH OIL PO) Take 2 tablets by mouth every morning. Fish oil with vitamin D3- 120mg  total    [provider]  Potassium 99 MG TABS Take 1 tablet by mouth every morning. Patient not taking: Reported on 11/26/2021    [provider]    Family History Family History  Problem Relation Age of Onset   Colon polyps Mother        pre-cancerous    Social History Social History   Tobacco Use   Smoking status: Some Days    Packs/day: 0.50    Types: Cigarettes   Smokeless tobacco: Never  Vaping Use   Vaping Use: Never used  Substance Use Topics   Alcohol use: No   Drug use: No     Allergies   Dextromethorphan, Dm [dextromethorphan-guaifenesin], and Lactaid [tilactase]   Review of Systems Review of Systems Per HPI  Physical Exam Triage Vital Signs ED Triage Vitals  Enc Vitals Group     BP 12/16/21 1024 110/70     Pulse Rate 12/16/21 1024 (!) 115     Resp 12/16/21 1024 (!) 26     Temp 12/16/21 1024 98 F (36.7 C)     Temp Source 12/16/21 1024 Oral     SpO2 12/16/21 1024 98 %     Weight --      Height --      Head Circumference --      Peak Flow --      Pain Score 12/16/21 1027 7     Pain Loc --      Pain Edu? --      Excl. in GC? --     No data found.  Updated Vital Signs BP 110/70 (BP Location: Right Arm)   Pulse (!) 115   Temp 98 F (36.7 C) (Oral)   Resp (!) 26   SpO2 98%   Visual Acuity Right Eye Distance:   Left Eye Distance:  Bilateral Distance:    Right Eye Near:   Left Eye Near:    Bilateral Near:     Physical Exam Constitutional:      Appearance: Normal appearance. He is ill-appearing. He is not toxic-appearing or diaphoretic.  HENT:     Head: Normocephalic and atraumatic.     Right Ear: Tympanic membrane, ear canal and external ear normal. There is no impacted cerumen.     Left Ear: Tympanic membrane, ear canal and external ear normal. There is no impacted cerumen.     Nose: Congestion present. No rhinorrhea.     Mouth/Throat:     Mouth: Mucous membranes are moist.     Pharynx: Oropharynx is clear. No posterior oropharyngeal erythema.  Eyes:     General: No scleral icterus.    Extraocular Movements: Extraocular movements intact.  Cardiovascular:     Rate and Rhythm: Regular rhythm. Tachycardia present.  Pulmonary:     Effort: Pulmonary effort is normal. No respiratory distress.     Breath sounds: Wheezing present. No rhonchi or rales.  Abdominal:     General: Abdomen is flat.     Palpations: Abdomen is soft.  Musculoskeletal:     Cervical back: Normal range of motion.  Lymphadenopathy:     Cervical: No cervical adenopathy.  Skin:    General: Skin is warm and dry.     Capillary Refill: Capillary refill takes less than 2 seconds.     Coloration: Skin is pale. Skin is not jaundiced.     Findings: No erythema or rash.  Neurological:     Mental Status: He is alert and oriented to person, place, and time.  Psychiatric:        Behavior: Behavior is cooperative.      UC Treatments / Results  Labs (all labs ordered are listed, but only abnormal results are displayed) Labs Reviewed  SARS CORONAVIRUS 2 (TAT 6-24 HRS)    EKG   Radiology DG Chest 2 View  Result Date:  12/16/2021 CLINICAL DATA:  Chest congestion question. Felt rib pop a few weeks ago. EXAM: CHEST - 2 VIEW COMPARISON:  Chest two views 04/19/2021 FINDINGS: Cardiac silhouette and mediastinal contours are within normal limits. The lungs are clear. No pleural effusion or pneumothorax. Mild multilevel degenerative disc changes of the thoracic spine. IMPRESSION: No active cardiopulmonary disease. Electronically Signed   By: Neita Garnet M.D.   On: 12/16/2021 10:58    Procedures Procedures (including critical care time)  Medications Ordered in UC Medications  ondansetron (ZOFRAN-ODT) disintegrating tablet 4 mg (4 mg Oral Given 12/16/21 1049)    Initial Impression / Assessment and Plan / UC Course  I have reviewed the triage vital signs and the nursing notes.  Pertinent labs & imaging results that were available during my care of the patient were reviewed by me and considered in my medical decision making (see chart for details).    In triage, patient is normotensive and afebrile, however he does have an elevated heart rate and is tachypneic.  He is oxygenating well on room air.  Radial pulse palpated and pulse felt to be regular-EKG deferred.  Zofran 4 mg given in urgent care for nausea/vomiting.  Chest x-ray today negative for acute cardiopulmonary disease.  Suspect viral illness as cause of symptoms.  Recommended IV fluids in urgent care for rehydration, but patient declines and would rather go home and try to push fluids orally.  Strict ER precautions discussed.  Prescription given for Phenergan as it  does not appear Zofran is giving much relief.  He is also likely been experiencing an asthma exacerbation for the past few weeks.  Start prednisone 40 mg daily for 5 days.  COVID-19 testing obtained, patient will be a good candidate for molnupiravir if he test positive.  Supportive care discussed.  ER and return precautions discussed.  Note given for work.  The patient was given the opportunity to ask  questions.  All questions answered to their satisfaction.  The patient is in agreement to this plan.    Final Clinical Impressions(s) / UC Diagnoses   Final diagnoses:  Encounter for screening for COVID-19  Moderate persistent asthma with acute exacerbation  Gastroenteritis  Tachycardia     Discharge Instructions      I am concerned about you.  Your heart rate is elevated and I think this is because of how much fluid you have lost from diarrhea. Please try to drink lots of fluid when you get home - sugar free electrolyte drinks or plain water.  You can take Phenergan for the nausea/vomiting.  If you are not able to keep fluids down, please go to the Emergency Room for IV fluids.   I am also concerned about your asthma, please start the prednisone tomorrow morning to treat inflammation in your lungs. The chest x-ray did not show pneumonia or any broken ribs.  You likely have a pulled muscle on your rib cage and this should improve over the next few weeks.  We have tested you for COVID-19 and will call you tomorrow if you test positive.    Some things that can make you feel better are: - Increased rest - Increasing fluid with water/sugar free electrolytes - Acetaminophen as needed for fever/pain  - OTC guaifenesin (Mucinex) - Saline sinus flushes or a neti pot - Humidifying the air        ED Prescriptions     Medication Sig Dispense Auth. Provider   predniSONE (DELTASONE) 20 MG tablet Take 2 tablets (40 mg total) by mouth daily with breakfast for 5 days. 10 tablet Noemi Chapel A, NP   promethazine (PHENERGAN) 25 MG tablet Take 1 tablet (25 mg total) by mouth every 8 (eight) hours as needed for nausea or vomiting. Do not take with alcohol or while driving or operating heavy machinery.  May cause drowsiness. 30 tablet Eulogio Bear, NP      PDMP not reviewed this encounter.   Eulogio Bear, NP 12/16/21 [phone]

## 2021-12-16 NOTE — ED Triage Notes (Signed)
Pt reports nausea, diarrhea, chills, body aches, chest congestion, chills started this morning  . Pt reports he feels stabbing sensation in rib cage after he sneezed and hear a pop 12 week ago.

## 2021-12-17 LAB — SARS CORONAVIRUS 2 (TAT 6-24 HRS): SARS Coronavirus 2: NEGATIVE

## 2022-01-27 ENCOUNTER — Other Ambulatory Visit (HOSPITAL_COMMUNITY): Payer: Self-pay | Admitting: Nurse Practitioner

## 2022-01-28 ENCOUNTER — Other Ambulatory Visit (HOSPITAL_COMMUNITY): Payer: Self-pay | Admitting: Nurse Practitioner

## 2022-04-21 ENCOUNTER — Encounter (HOSPITAL_COMMUNITY): Payer: Self-pay | Admitting: *Deleted

## 2022-05-15 DIAGNOSIS — H6691 Otitis media, unspecified, right ear: Secondary | ICD-10-CM | POA: Diagnosis not present

## 2022-05-15 DIAGNOSIS — Z6833 Body mass index (BMI) 33.0-33.9, adult: Secondary | ICD-10-CM | POA: Diagnosis not present

## 2022-05-15 DIAGNOSIS — J45901 Unspecified asthma with (acute) exacerbation: Secondary | ICD-10-CM | POA: Diagnosis not present

## 2022-05-15 DIAGNOSIS — E669 Obesity, unspecified: Secondary | ICD-10-CM | POA: Diagnosis not present

## 2022-06-06 ENCOUNTER — Other Ambulatory Visit (HOSPITAL_COMMUNITY): Payer: Self-pay | Admitting: *Deleted

## 2022-06-06 MED ORDER — MULTAQ 400 MG PO TABS: 400.0000 mg | ORAL_TABLET | Freq: Two times a day (BID) | ORAL | 0 refills | Status: DC

## 2022-07-06 ENCOUNTER — Other Ambulatory Visit (HOSPITAL_COMMUNITY): Payer: Self-pay | Admitting: Physician Assistant

## 2022-08-05 DIAGNOSIS — E669 Obesity, unspecified: Secondary | ICD-10-CM | POA: Diagnosis not present

## 2022-08-05 DIAGNOSIS — H6691 Otitis media, unspecified, right ear: Secondary | ICD-10-CM | POA: Diagnosis not present

## 2022-08-05 DIAGNOSIS — I959 Hypotension, unspecified: Secondary | ICD-10-CM | POA: Diagnosis not present

## 2022-08-05 DIAGNOSIS — Z6833 Body mass index (BMI) 33.0-33.9, adult: Secondary | ICD-10-CM | POA: Diagnosis not present

## 2022-08-09 DIAGNOSIS — R197 Diarrhea, unspecified: Secondary | ICD-10-CM | POA: Diagnosis not present

## 2022-08-09 DIAGNOSIS — J069 Acute upper respiratory infection, unspecified: Secondary | ICD-10-CM | POA: Diagnosis not present

## 2022-08-09 DIAGNOSIS — Z6834 Body mass index (BMI) 34.0-34.9, adult: Secondary | ICD-10-CM | POA: Diagnosis not present

## 2022-08-09 DIAGNOSIS — R030 Elevated blood-pressure reading, without diagnosis of hypertension: Secondary | ICD-10-CM | POA: Diagnosis not present

## 2022-08-16 ENCOUNTER — Encounter (INDEPENDENT_AMBULATORY_CARE_PROVIDER_SITE_OTHER): Payer: Self-pay | Admitting: *Deleted

## 2022-08-25 ENCOUNTER — Ambulatory Visit (INDEPENDENT_AMBULATORY_CARE_PROVIDER_SITE_OTHER): Payer: No Typology Code available for payment source | Admitting: Gastroenterology

## 2022-08-25 ENCOUNTER — Encounter (INDEPENDENT_AMBULATORY_CARE_PROVIDER_SITE_OTHER): Payer: Self-pay | Admitting: Gastroenterology

## 2022-08-25 ENCOUNTER — Telehealth: Payer: Self-pay | Admitting: *Deleted

## 2022-08-25 ENCOUNTER — Telehealth (INDEPENDENT_AMBULATORY_CARE_PROVIDER_SITE_OTHER): Payer: Self-pay | Admitting: Gastroenterology

## 2022-08-25 VITALS — BP 115/72 | HR 78 | Temp 97.9°F | Ht 69.0 in | Wt 229.0 lb

## 2022-08-25 DIAGNOSIS — R197 Diarrhea, unspecified: Secondary | ICD-10-CM | POA: Diagnosis not present

## 2022-08-25 DIAGNOSIS — R140 Abdominal distension (gaseous): Secondary | ICD-10-CM

## 2022-08-25 DIAGNOSIS — K529 Noninfective gastroenteritis and colitis, unspecified: Secondary | ICD-10-CM | POA: Insufficient documentation

## 2022-08-25 NOTE — Telephone Encounter (Signed)
I s/w the pt and he has been scheduled for tele pre op appt 09/01/22 @ 10:20. Med rec and consent are done. In reviewing meds with the pt he tells me that he has not taken diltiazem 120 or the Multaq for about 1 month. I informed the pt that I will let the A-fib clinic know so that they can send in a refill for Multaq as pt states he has is out of. Pt wants to know if he needs to take diltiazem 120 mg daily, as he has not been taking x 1 month as well.   Pt thanked me for the help and the call and will wait to hear back from the a-fib clinic about medications noted above.      Patient Consent for Virtual Visit        Robert Gordon has provided verbal consent on 08/25/2022 for a virtual visit (video or telephone).   CONSENT FOR VIRTUAL VISIT FOR:  Robert Gordon  By participating in this virtual visit I agree to the following:  I hereby voluntarily request, consent and authorize [facility] and its employed or contracted physicians, physician assistants, nurse practitioners or other licensed health care professionals (the Practitioner), to provide me with telemedicine health care services (the "Services") as deemed necessary by the treating Practitioner. I acknowledge and consent to receive the Services by the Practitioner via telemedicine. I understand that the telemedicine visit will involve communicating with the Practitioner through live audiovisual communication technology and the disclosure of certain medical information by electronic transmission. I acknowledge that I have been given the opportunity to request an in-person assessment or other available alternative prior to the telemedicine visit and am voluntarily participating in the telemedicine visit.  I understand that I have the right to withhold or withdraw my consent to the use of telemedicine in the course of my care at any time, without affecting my right to future care or treatment, and that the Practitioner or I may  terminate the telemedicine visit at any time. I understand that I have the right to inspect all information obtained and/or recorded in the course of the telemedicine visit and may receive copies of available information for a reasonable fee.  I understand that some of the potential risks of receiving the Services via telemedicine include:  Delay or interruption in medical evaluation due to technological equipment failure or disruption; Information transmitted may not be sufficient (e.g. poor resolution of images) to allow for appropriate medical decision making by the Practitioner; and/or  In rare instances, security protocols could fail, causing a breach of personal health information.  Furthermore, I acknowledge that it is my responsibility to provide information about my medical history, conditions and care that is complete and accurate to the best of my ability. I acknowledge that Practitioner's advice, recommendations, and/or decision may be based on factors not within their control, such as incomplete or inaccurate data provided by me or distortions of diagnostic images or specimens that may result from electronic transmissions. I understand that the practice of medicine is not an exact science and that Practitioner makes no warranties or guarantees regarding treatment outcomes. I acknowledge that a copy of this consent can be made available to me via my patient portal [facility] MyChart), or I can request a printed copy by calling the office of [facility].    I understand that my insurance will be billed for this visit.   I have read or had this consent  read to me. I understand the contents of this consent, which adequately explains the benefits and risks of the Services being provided via telemedicine.  I have been provided ample opportunity to ask questions regarding this consent and the Services and have had my questions answered to my satisfaction. I give my informed consent for  the services to be provided through the use of telemedicine in my medical care

## 2022-08-25 NOTE — Telephone Encounter (Signed)
    08/25/22  Neena Rhymes 08-21-1965  What type of surgery is being performed? Colonoscopy  When is surgery scheduled? TBD  What type of clearance is required (medical or pharmacy to hold medication or both? Cardiac  Name of physician performing surgery?  Dr. Katrinka Blazing [facility] at [facility] Phone: [phone] Fax: [phone]  Anethesia type (none, local, MAC, general)? MAC

## 2022-08-25 NOTE — Telephone Encounter (Signed)
Pt in office today and needing TCS (ASA 3) with random biopsies. Pt needing cardiac clearance from Af[facility] clinic first. Sent cardiac clearance to pool.

## 2022-08-25 NOTE — Telephone Encounter (Signed)
Primary Cardiologist:Samuel Diona Browner, MD   Preoperative team, please contact this patient and set up a phone call appointment for further preoperative risk assessment. Please obtain consent and complete medication review. Thank you for your help.   I confirm that guidance regarding antiplatelet and oral anticoagulation therapy has been completed and, if necessary, noted below (none requested).    Levi Aland, NP-C  08/25/2022, 12:41 PM [address] Office [phone] Fax [phone]

## 2022-08-25 NOTE — Patient Instructions (Addendum)
We will check celiac panel  Please follow up with  [facility]([phone]) as you have not seen them in over a year and recently ran out of your medications, would like for you to see them prior to [place] scheduling the colonoscopy, once we have clearance from them, we will reach out to schedule  Follow up 4 months

## 2022-08-25 NOTE — Progress Notes (Addendum)
Referring Provider: [facility] Physician:  [facility] Primary GI Physician: new   Chief Complaint  Patient presents with   Diarrhea    Referred for issues with diarrhea. Has diarrhea 3 -4 days out of the week.    HPI:   Robert Gordon is a 57 y.o. male with past medical history of arthritis, anemia, diverticulitis, GERD, HLD, IBS, Paroxysmal A fib  Patient presenting today as a new patient for diarrhea   Patient states that he has been having diarrhea. He filled out questionnaire for colonoscopy and was advised to come in prior to this due to diarrhea. He notes that he has had diarrhea for about 1-2 year.  stools are usually loose, sometimes watery. Notably he had constipation in the past and was on linzess years back. Having a BM almost everytime he eats with some urgency. He is eating more salads now and seems to have improved his diarrhea some, though notably reports taking a fiber supplement in the past worsened his diarrhea. Breads make him bloated and cause more diarrhea.  Dairy makes things worse as well. He has an occasional semi solid stool maybe once per month. He tries to avoid imodium as this can cause him to become constipated. Has tried probiotic pills which made diarrhea worse. Has rare abdominal pain. No rectal bleeding or melena.  No weight loss or changes in appetite.   Has not seen cards in over a year for history of Paroxysmal A fib, recently ran out of his multaq. Denies feeling any episodes of A fib recently. He does not take any ACs.   NSAID use: just tylenol PRN  Social hx: no tobacco in about 4 years, rare etoh  Fam hx: no CRC or liver disease, does not know history other than his mother.   Last Colonoscopy:2014 diverticulosis, hemorrhoids  Last Endoscopy: never   Recommendations:    Past Medical History:  Diagnosis Date   Arthritis    Asthma    Avascular necrosis of bones of both hips (HCC)    Diverticulitis     s/p sigmoid colectomy   GERD (gastroesophageal reflux disease)    Hyperlipidemia    IBS (irritable bowel syndrome)    Paroxysmal atrial fibrillation [facility])    Diagnosed March 2022    Past Surgical History:  Procedure Laterality Date   COLONOSCOPY N/A 10/02/2012   Procedure: COLONOSCOPY;  Surgeon: West Bali, MD;  Location: [facility];  Service: Endoscopy;  Laterality: N/A;  11:00   HIP SURGERY Left    LAPAROSCOPIC SIGMOID COLECTOMY  09/11/2006   SIGMOIDOSCOPY  08/14/2006   WUJ:[id number] diverticula seen beginning 20 cm from the anal verge and extending to 50 cm from the anal verge/Normal retroflexed view of the rectum.   TONSILLECTOMY      Current Outpatient Medications  Medication Sig Dispense Refill   albuterol (PROVENTIL) (2.5 MG/3ML) 0.083% nebulizer solution Take 2.5 mg by nebulization every 4 (four) hours as needed for shortness of breath. For shortness of breath     albuterol (VENTOLIN HFA) 108 (90 Base) MCG/ACT inhaler Inhale 1 puff into the lungs every 6 (six) hours as needed for wheezing or shortness of breath.     atorvastatin (LIPITOR) 40 MG tablet Take 20 mg by mouth at bedtime.     buPROPion ER (WELLBUTRIN SR) 100 MG 12 hr tablet Take 1 tablet by mouth daily.     Cholecalciferol 50 MCG (2000 UT) TABS Take 4,000 Units  by mouth daily.     diltiazem (CARDIZEM CD) 120 MG 24 hr capsule Take 1 capsule (120 mg total) by mouth daily. 30 capsule 3   diltiazem (CARDIZEM) 30 MG tablet Take 1 tablet (30 mg total) by mouth as needed. For palpitations.  Take 1 tablet if palpitations do not stop and 1 hour.  Take second tablet.  If they do not stop after that seek medical attention. 30 tablet 6   gabapentin (NEURONTIN) 100 MG capsule Take 100 mg by mouth daily as needed (pain).     montelukast (SINGULAIR) 10 MG tablet Take 10 mg by mouth at bedtime.     predniSONE (DELTASONE) 20 MG tablet Take 20 mg by mouth. As needed for acute exacerbation of asthma     promethazine (PHENERGAN) 25  MG tablet Take 1 tablet (25 mg total) by mouth every 8 (eight) hours as needed for nausea or vomiting. Do not take with alcohol or while driving or operating heavy machinery.  May cause drowsiness. 30 tablet 0   Tiotropium Bromide Monohydrate 2.5 MCG/ACT AERS Inhale into the lungs. Inhale 2 inhalation every day     dronedarone (MULTAQ) 400 MG tablet Take 1 tablet (400 mg total) by mouth 2 (two) times daily with a meal. Appt req for refill [id number] 60 tablet 0   No current facility-administered medications for this visit.    Allergies as of 08/25/2022 - Review Complete 08/25/2022  Allergen Reaction Noted   Dextromethorphan Other (See Comments) 02/05/2011   Dm [dextromethorphan-guaifenesin] Other (See Comments) 09/24/2010   Lactaid [tilactase] Diarrhea, Nausea And Vomiting, and Other (See Comments) 09/05/2012    Family History  Problem Relation Age of Onset   Colon polyps Mother        pre-cancerous    Social History   Socioeconomic History   Marital status: Married    Spouse name: Not on file   Number of children: Not on file   Years of education: Not on file   Highest education level: Not on file  Occupational History   Occupation: Transport planner: [employer]  Tobacco Use   Smoking status: Former    Packs/day: .5    Types: Cigarettes    Quit date: 2020    Years since quitting: 4.4    Passive exposure: Past   Smokeless tobacco: Never  Vaping Use   Vaping Use: Never used  Substance and Sexual Activity   Alcohol use: No   Drug use: No   Sexual activity: Not on file  Other Topics Concern   Not on file  Social History Narrative   Not on file   Social Determinants of Health   Financial Resource Strain: Not on file  Food Insecurity: Not on file  Transportation Needs: Not on file  Physical Activity: Not on file  Stress: Not on file  Social Connections: Not on file   Review of systems General: negative for malaise, night  sweats, fever, chills, weight loss Neck: Negative for lumps, goiter, pain and significant neck swelling Resp: Negative for cough, wheezing, dyspnea at rest CV: Negative for chest pain, leg swelling, palpitations, orthopnea GI: denies melena, hematochezia, nausea, vomiting, constipation, dysphagia, odyonophagia, early satiety or unintentional weight loss. +diarrhea +bloating  MSK: Negative for joint pain or swelling, back pain, and muscle pain. Derm: Negative for itching or rash Psych: Denies depression, anxiety, memory loss, confusion. No homicidal or suicidal ideation.  Heme: Negative for prolonged bleeding, bruising easily, and swollen nodes. Endocrine:  Negative for cold or heat intolerance, polyuria, polydipsia and goiter. Neuro: negative for tremor, gait imbalance, syncope and seizures. The remainder of the review of systems is noncontributory.  Physical Exam: BP 115/72 (BP Location: Left Arm, Patient Position: Sitting, Cuff Size: Large)   Pulse 78   Temp 97.9 F (36.6 C) (Oral)   Ht 5\' 9"  (1.753 m)   Wt 229 lb (103.9 kg)   BMI 33.82 kg/m  General:   Alert and oriented. No distress noted. Pleasant and cooperative.  Head:  Normocephalic and atraumatic. Eyes:  Conjuctiva clear without scleral icterus. Mouth:  Oral mucosa pink and moist. Good dentition. No lesions. Heart: Normal rate and rhythm, s1 and s2 heart sounds present.  Lungs: Clear lung sounds in all lobes. Respirations equal and unlabored. Abdomen:  +BS, soft, non-tender and non-distended. No rebound or guarding. No HSM or masses noted. Derm: No palmar erythema or jaundice Msk:  Symmetrical without gross deformities. Normal posture. Extremities:  Without edema. Neurologic:  Alert and  oriented x4 Psych:  Alert and cooperative. Normal mood and affect.  Invalid input(s): "6 MONTHS"   ASSESSMENT: TAEGEN MOLESKY is a 57 y.o. male presenting today as a new patient for diarrhea and bloating  Ongoing Diarrhea and  bloating worse with dairy and breads. Rare abdominal pain. No rectal bleeding, melena, weight loss. Last TCS in 2014. Will check celiac panel to rule out underlying causes of diarrhea. He had labs done with PCP in May with TSH 1.014, normal sodium, potassium, calcium 9.8. he had a sigmoidectomy in 2008 following a complicated bout of diverticulitis, this could potentially be contributing to his symptoms however, he has only had diarrhea for the past 1-2 years. etiology of diarrhea unclear at this time, would recommend proceeding with colonoscopy, can obtain random colonic biopsies to rule out microscopic colitis at that time as well.  He has not seen A fib clinic in over 1 year and recently ran out of his Multaq, I would recommend he have follow up/clearance with them prior to proceeding with Colonoscopy. He will reach out to schedule follow up with them. We will await clearance thereafter and schedule colonoscopy as appropriate. Indications, risks and benefits of procedure discussed in detail with patient. Patient verbalized understanding and is in agreement to proceed with Colonoscopy.    PLAN:  Colonoscopy- with random biopsies-ASA III (cardiac clearance prior to)  2.  Celiac testing  3. Continue to avoid trigger foods  All questions were answered, patient verbalized understanding and is in agreement with plan as outlined above.   Follow Up: 3 months   Arvil Utz L. Jeanmarie Hubert, MSN, APRN, AGNP-C Adult-Gerontology Nurse Practitioner [facility]  I have reviewed the note and agree with the APP's assessment as described in this progress note  Katrinka Blazing, MD Gastroenterology and Hepatology [facility] Gastroenterology

## 2022-08-25 NOTE — Telephone Encounter (Signed)
I s/w the pt and he has been scheduled for tele pre op appt 09/01/22 @ 10:20. Med rec and consent are done. In reviewing meds with the pt he tells me that he has not taken diltiazem 120 or the Multaq for about 1 month. I informed the pt that I will let the A-fib clinic know so that they can send in a refill for Multaq as pt states he has is out of. Pt wants to know if he needs to take diltiazem 120 mg daily, as he has not been taking x 1 month as well.    Pt thanked me for the help and the call and will wait to hear back from the a-fib clinic about medications noted above.

## 2022-08-25 NOTE — Telephone Encounter (Signed)
Thanks, looking forward to hearing regarding the pre procedural clearance eval.

## 2022-08-26 ENCOUNTER — Other Ambulatory Visit (HOSPITAL_COMMUNITY): Payer: Self-pay | Admitting: *Deleted

## 2022-08-26 MED ORDER — DILTIAZEM HCL ER COATED BEADS 120 MG PO CP24: 120.0000 mg | ORAL_CAPSULE | Freq: Every day | ORAL | 0 refills | Status: DC

## 2022-08-26 MED ORDER — MULTAQ 400 MG PO TABS: 400.0000 mg | ORAL_TABLET | Freq: Two times a day (BID) | ORAL | 0 refills | Status: DC

## 2022-08-28 LAB — CELIAC DISEASE PANEL
Endomysial IgA: NEGATIVE
IgA/Immunoglobulin A, Serum: 284 mg/dL (ref 90–386)
Transglutaminase IgA: <2 U/mL (ref 0–3)

## 2022-09-01 ENCOUNTER — Ambulatory Visit: Payer: No Typology Code available for payment source | Attending: Cardiology

## 2022-09-01 DIAGNOSIS — Z01810 Encounter for preprocedural cardiovascular examination: Secondary | ICD-10-CM | POA: Diagnosis not present

## 2022-09-01 NOTE — Progress Notes (Signed)
Virtual Visit via Telephone Note   Because of Robert Gordon's co-morbid illnesses, he is at least at moderate risk for complications without adequate follow up.  This format is felt to be most appropriate for this patient at this time.  The patient did not have access to video technology/had technical difficulties with video requiring transitioning to audio format only (telephone).  All issues noted in this document were discussed and addressed.  No physical exam could be performed with this format.  Please refer to the patient's chart for his consent to telehealth for [facility].  Evaluation Performed:  Preoperative cardiovascular risk assessment _____________   Date:  09/01/2022   Patient ID:  Robert Gordon, DOB [birth year], MRN [id number] Patient Location:  Home Provider location:   Office  Primary Care Provider:  [facility] Primary Cardiologist:  Nona Dell, MD  Chief Complaint / Patient Profile   57 y.o. y/o male with a h/o paroxysmal atrial fibrillation, aortic root dilation, OSA, who is pending colonoscopy and presents today for telephonic preoperative cardiovascular risk assessment.  History of Present Illness    Robert Gordon is a 57 y.o. male who presents via audio/video conferencing for a telehealth visit today.  Pt was last seen in cardiology clinic on 05/10/2021 by Ronie Spies PA-C.  At that time ENKI MASLEY was doing well .  The patient is now pending procedure as outlined above. Since his last visit, he continues to be stable from a cardiac standpoint.  Today he denies chest pain, shortness of breath, lower extremity edema, fatigue, palpitations, melena, hematuria, hemoptysis, diaphoresis, weakness, presyncope, syncope, orthopnea, and PND.   Past Medical History    Past Medical History:  Diagnosis Date   Arthritis    Asthma    Avascular necrosis of bones of both hips (HCC)    Diverticulitis    s/p sigmoid colectomy   GERD  (gastroesophageal reflux disease)    Hyperlipidemia    IBS (irritable bowel syndrome)    Paroxysmal atrial fibrillation [facility])    Diagnosed March 2022   Past Surgical History:  Procedure Laterality Date   COLONOSCOPY N/A 10/02/2012   Procedure: COLONOSCOPY;  Surgeon: West Bali, MD;  Location: [facility];  Service: Endoscopy;  Laterality: N/A;  11:00   HIP SURGERY Left    LAPAROSCOPIC SIGMOID COLECTOMY  09/11/2006   SIGMOIDOSCOPY  08/14/2006   WUJ:[id number] diverticula seen beginning 20 cm from the anal verge and extending to 50 cm from the anal verge/Normal retroflexed view of the rectum.   TONSILLECTOMY      Allergies  Allergies  Allergen Reactions   Dextromethorphan Other (See Comments)    Thinks it may be a causative agent in the asthma attacks, unsure. Had an attack one time after taking cough medicine containing dextromethorphan.    Dm [Dextromethorphan-Guaifenesin] Other (See Comments)    Hurts chest.   Lactaid [Tilactase] Diarrhea, Nausea And Vomiting and Other (See Comments)    Abdominal cramping/bloating    Home Medications    Prior to Admission medications   Medication Sig Start Date End Date Taking? Authorizing Provider  albuterol (PROVENTIL) (2.5 MG/3ML) 0.083% nebulizer solution Take 2.5 mg by nebulization every 4 (four) hours as needed for shortness of breath. For shortness of breath    [provider]  albuterol (VENTOLIN HFA) 108 (90 Base) MCG/ACT inhaler Inhale 1 puff into the lungs every 6 (six) hours as needed for wheezing or shortness of breath.  [provider]  atorvastatin (LIPITOR) 40 MG tablet Take 20 mg by mouth at bedtime.    [provider]  buPROPion ER (WELLBUTRIN SR) 100 MG 12 hr tablet Take 1 tablet by mouth daily. 07/23/20   [provider]  Cholecalciferol 50 MCG (2000 UT) TABS Take 4,000 Units by mouth daily. 07/19/19   [provider]  diltiazem (CARDIZEM CD) 120 MG 24 hr capsule Take 1 capsule  (120 mg total) by mouth daily. 08/26/22   Fenton, Clint R, PA  diltiazem (CARDIZEM) 30 MG tablet Take 1 tablet (30 mg total) by mouth as needed. For palpitations.  Take 1 tablet if palpitations do not stop and 1 hour.  Take second tablet.  If they do not stop after that seek medical attention. Patient not taking: Reported on 08/25/2022 03/24/21   Jonelle Sidle, MD  dronedarone (MULTAQ) 400 MG tablet Take 1 tablet (400 mg total) by mouth 2 (two) times daily with a meal. Appt req for refill [id number] 08/26/22   Fenton, Clint R, PA  gabapentin (NEURONTIN) 100 MG capsule Take 100 mg by mouth daily as needed (pain). 07/23/20   [provider]  montelukast (SINGULAIR) 10 MG tablet Take 10 mg by mouth at bedtime.    [provider]  predniSONE (DELTASONE) 20 MG tablet Take 20 mg by mouth. As needed for acute exacerbation of asthma Patient not taking: Reported on 08/25/2022    [provider]  promethazine (PHENERGAN) 25 MG tablet Take 1 tablet (25 mg total) by mouth every 8 (eight) hours as needed for nausea or vomiting. Do not take with alcohol or while driving or operating heavy machinery.  May cause drowsiness. Patient not taking: Reported on 08/25/2022 12/16/21   Valentino Nose, NP  Tiotropium Bromide Monohydrate 2.5 MCG/ACT AERS Inhale into the lungs. Inhale 2 inhalation every day    [provider]    Physical Exam    Vital Signs:  Robert Gordon does not have vital signs available for review today.  Given telephonic nature of communication, physical exam is limited. AAOx3. NAD. Normal affect.  Speech and respirations are unlabored.  Accessory Clinical Findings    None  Assessment & Plan    1.  Preoperative Cardiovascular Risk Assessment: Colonoscopy, Dr. Levon Hedger, fax #[phone]      Primary Cardiologist: Nona Dell, MD  Chart reviewed as part of pre-operative protocol coverage. Given past medical history and time since last visit,  based on ACC/AHA guidelines, COGAN VANZANT would be at acceptable risk for the planned procedure without further cardiovascular testing.   Patient was advised that if he develops new symptoms prior to surgery to contact our office to arrange a follow-up appointment.  He verbalized understanding.  I will route this recommendation to the requesting party via Epic fax function and remove from pre-op pool.       Time:   Today, I have spent 11 minutes with the patient with telehealth technology discussing medical history, symptoms, and management plan.  Prior to his phone evaluation I spent greater than 10 minutes reviewing his past medical history and cardiac medications.   Ronney Asters, NP  09/01/2022, 7:39 AM

## 2022-09-05 ENCOUNTER — Telehealth (INDEPENDENT_AMBULATORY_CARE_PROVIDER_SITE_OTHER): Payer: Self-pay | Admitting: Gastroenterology

## 2022-09-05 NOTE — Telephone Encounter (Signed)
Dolores Frame, MD  Ronney Asters, NP; Marlowe Shores, LPN Thanks. Angeles Paolucci, please schedule this patient in room 3.  He was cleared by the cardiology clinic to undergo procedure.

## 2022-09-28 NOTE — Telephone Encounter (Signed)
Left message to return call 

## 2022-10-10 ENCOUNTER — Other Ambulatory Visit (HOSPITAL_COMMUNITY): Payer: Self-pay | Admitting: Physician Assistant

## 2022-10-11 MED ORDER — PEG 3350-KCL-NA BICARB-NACL 420 G PO SOLR: 4000.0000 mL | Freq: Once | ORAL | 0 refills | Status: AC

## 2022-10-11 NOTE — Telephone Encounter (Signed)
Pt returned call and schedule TCS for 11/08/22 for 2pm. Prep sent to pharmacy. Instructions sent to pt via mail. Will call pt with pre op or send letter.

## 2022-10-11 NOTE — Addendum Note (Signed)
Addended by: Marlowe Shores on: 10/11/2022 03:02 PM   Modules accepted: Orders

## 2022-10-12 ENCOUNTER — Encounter (INDEPENDENT_AMBULATORY_CARE_PROVIDER_SITE_OTHER): Payer: Self-pay

## 2022-11-03 NOTE — Patient Instructions (Signed)
Robert Gordon  11/03/2022     @PREFPERIOPPHARMACY @   Your procedure is scheduled on  11/08/2022.   Report to [facility] at  1215  P.M.   Call this number if you have problems the morning of surgery:  [phone]  If you experience any cold or flu symptoms such as cough, fever, chills, shortness of breath, etc. between now and your scheduled surgery, please notify us at the above number.   Remember:  Follow the diet and prep instructions given to you by the office.     Use your nebulizer and your inhalers before yo come and bring your rescue inhaler with you.     Take these medicines the morning of surgery with A SIP OF WATER             wellbutrin, diltiazem, multaq, gabapentin.     Do not wear jewelry, make-up or nail polish, including gel polish,  artificial nails, or any other type of covering on natural nails (fingers and  toes).  Do not wear lotions, powders, or perfumes, or deodorant.  Do not shave 48 hours prior to surgery.  Men may shave face and neck.  Do not bring valuables to the hospital.  [facility] is not responsible for any belongings or valuables.  Contacts, dentures or bridgework may not be worn into surgery.  Leave your suitcase in the car.  After surgery it may be brought to your room.  For patients admitted to the hospital, discharge time will be determined by your treatment team.  Patients discharged the day of surgery will not be allowed to drive home and must have someone with th em for 24 hours.    Special instructions:   DO NOT smoke tobacco or vape for 24 hours before your procedure.  Please read over the following fact sheets that you were given. Anesthesia Post-op Instructions and Care and Recovery After Surgery      Colonoscopy, Adult, Care After The following information offers guidance on how to care for yourself after your procedure. Your health care provider may also give you more specific instructions. If you have  problems or questions, contact your health care provider. What can I expect after the procedure? After the procedure, it is common to have: A small amount of blood in your stool for 24 hours after the procedure. Some gas. Mild cramping or bloating of your abdomen. Follow these instructions at home: Eating and drinking  Drink enough fluid to keep your urine pale yellow. Follow instructions from your health care provider about eating or drinking restrictions. Resume your normal diet as told by your health care provider. Avoid heavy or fried foods that are hard to digest. Activity Rest as told by your health care provider. Avoid sitting for a long time without moving. Get up to take short walks every 1-2 hours. This is important to improve blood flow and breathing. Ask for help if you feel weak or unsteady. Return to your normal activities as told by your health care provider. Ask your health care provider what activities are safe for you. Managing cramping and bloating  Try walking around when you have cramps or feel bloated. If directed, apply heat to your abdomen as told by your health care provider. Use the heat source that your health care provider recommends, such as a moist heat pack or a heating pad. Place a towel between your skin and the heat source. Leave the heat on  for 20-30 minutes. Remove the heat if your skin turns bright red. This is especially important if you are unable to feel pain, heat, or cold. You have a greater risk of getting burned. General instructions If you were given a sedative during the procedure, it can affect you for several hours. Do not drive or operate machinery until your health care provider says that it is safe. For the first 24 hours after the procedure: Do not sign important documents. Do not drink alcohol. Do your regular daily activities at a slower pace than normal. Eat soft foods that are easy to digest. Take over-the-counter and prescription  medicines only as told by your health care provider. Keep all follow-up visits. This is important. Contact a health care provider if: You have blood in your stool 2-3 days after the procedure. Get help right away if: You have more than a small spotting of blood in your stool. You have large blood clots in your stool. You have swelling of your abdomen. You have nausea or vomiting. You have a fever. You have increasing pain in your abdomen that is not relieved with medicine. These symptoms may be an emergency. Get help right away. Call 911. Do not wait to see if the symptoms will go away. Do not drive yourself to the hospital. Summary After the procedure, it is common to have a small amount of blood in your stool. You may also have mild cramping and bloating of your abdomen. If you were given a sedative during the procedure, it can affect you for several hours. Do not drive or operate machinery until your health care provider says that it is safe. Get help right away if you have a lot of blood in your stool, nausea or vomiting, a fever, or increased pain in your abdomen. This information is not intended to replace advice given to you by your health care provider. Make sure you discuss any questions you have with your health care provider. Document Revised: 04/12/2022 Document Reviewed: 10/21/2020 Elsevier Patient Education  2024 [employer] Monitored Anesthesia Care, Care After The following information offers guidance on how to care for yourself after your procedure. Your health care provider may also give you more specific instructions. If you have problems or questions, contact your health care provider. What can I expect after the procedure? After the procedure, it is common to have: Tiredness. Little or no memory about what happened during or after the procedure. Impaired judgment when it comes to making decisions. Nausea or vomiting. Some trouble with balance. Follow these  instructions at home: For the time period you were told by your health care provider:  Rest. Do not participate in activities where you could fall or become injured. Do not drive or use machinery. Do not drink alcohol. Do not take sleeping pills or medicines that cause drowsiness. Do not make important decisions or sign legal documents. Do not take care of children on your own. Medicines Take over-the-counter and prescription medicines only as told by your health care provider. If you were prescribed antibiotics, take them as told by your health care provider. Do not stop using the antibiotic even if you start to feel better. Eating and drinking Follow instructions from your health care provider about what you may eat and drink. Drink enough fluid to keep your urine pale yellow. If you vomit: Drink clear fluids slowly and in small amounts as you are able. Clear fluids include water, ice chips, low-calorie sports drinks, and fruit juice  that has water added to it (diluted fruit juice). Eat light and bland foods in small amounts as you are able. These foods include bananas, applesauce, rice, lean meats, toast, and crackers. General instructions  Have a responsible adult stay with you for the time you are told. It is important to have someone help care for you until you are awake and alert. If you have sleep apnea, surgery and some medicines can increase your risk for breathing problems. Follow instructions from your health care provider about wearing your sleep device: When you are sleeping. This includes during daytime naps. While taking prescription pain medicines, sleeping medicines, or medicines that make you drowsy. Do not use any products that contain nicotine or tobacco. These products include cigarettes, chewing tobacco, and vaping devices, such as e-cigarettes. If you need help quitting, ask your health care provider. Contact a health care provider if: You feel nauseous or vomit  every time you eat or drink. You feel light-headed. You are still sleepy or having trouble with balance after 24 hours. You get a rash. You have a fever. You have redness or swelling around the IV site. Get help right away if: You have trouble breathing. You have new confusion after you get home. These symptoms may be an emergency. Get help right away. Call 911. Do not wait to see if the symptoms will go away. Do not drive yourself to the hospital. This information is not intended to replace advice given to you by your health care provider. Make sure you discuss any questions you have with your health care provider. Document Revised: 07/26/2021 Document Reviewed: 07/26/2021 Elsevier Patient Education  2024 [employer].

## 2022-11-04 ENCOUNTER — Encounter (HOSPITAL_COMMUNITY)
Admission: RE | Admit: 2022-11-04 | Discharge: 2022-11-04 | Disposition: A | Discharge status: Home / Self Care | Payer: No Typology Code available for payment source | Source: Ambulatory Visit | Attending: Gastroenterology | Admitting: Gastroenterology

## 2022-11-04 ENCOUNTER — Encounter (HOSPITAL_COMMUNITY): Payer: Self-pay

## 2022-11-04 ENCOUNTER — Other Ambulatory Visit: Payer: Self-pay

## 2022-11-04 VITALS — BP 105/67 | HR 77 | Temp 98.5°F | Resp 18 | Ht 69.0 in | Wt 229.1 lb

## 2022-11-04 DIAGNOSIS — Z01810 Encounter for preprocedural cardiovascular examination: Secondary | ICD-10-CM | POA: Diagnosis not present

## 2022-11-04 DIAGNOSIS — Z01812 Encounter for preprocedural laboratory examination: Secondary | ICD-10-CM | POA: Diagnosis present

## 2022-11-04 DIAGNOSIS — I4891 Unspecified atrial fibrillation: Secondary | ICD-10-CM | POA: Insufficient documentation

## 2022-11-04 HISTORY — DX: Depression, unspecified: F32.A

## 2022-11-04 HISTORY — DX: Sleep apnea, unspecified: G47.30

## 2022-11-08 ENCOUNTER — Ambulatory Visit (HOSPITAL_COMMUNITY): Payer: No Typology Code available for payment source | Admitting: Anesthesiology

## 2022-11-08 ENCOUNTER — Encounter (INDEPENDENT_AMBULATORY_CARE_PROVIDER_SITE_OTHER): Payer: Self-pay | Admitting: *Deleted

## 2022-11-08 ENCOUNTER — Encounter (HOSPITAL_COMMUNITY): Payer: Self-pay | Admitting: Gastroenterology

## 2022-11-08 ENCOUNTER — Ambulatory Visit (HOSPITAL_COMMUNITY)
Admission: RE | Admit: 2022-11-08 | Discharge: 2022-11-08 | Disposition: A | Discharge status: Home / Self Care | Payer: No Typology Code available for payment source | Attending: Gastroenterology | Admitting: Gastroenterology

## 2022-11-08 ENCOUNTER — Encounter (HOSPITAL_COMMUNITY)
Admission: RE | Disposition: A | Discharge status: Home / Self Care | Payer: Self-pay | Source: Home / Self Care | Attending: Gastroenterology

## 2022-11-08 DIAGNOSIS — G4730 Sleep apnea, unspecified: Secondary | ICD-10-CM | POA: Insufficient documentation

## 2022-11-08 DIAGNOSIS — K529 Noninfective gastroenteritis and colitis, unspecified: Secondary | ICD-10-CM | POA: Diagnosis present

## 2022-11-08 DIAGNOSIS — E785 Hyperlipidemia, unspecified: Secondary | ICD-10-CM | POA: Insufficient documentation

## 2022-11-08 DIAGNOSIS — K635 Polyp of colon: Secondary | ICD-10-CM | POA: Diagnosis not present

## 2022-11-08 DIAGNOSIS — R197 Diarrhea, unspecified: Secondary | ICD-10-CM | POA: Diagnosis present

## 2022-11-08 DIAGNOSIS — Z87891 Personal history of nicotine dependence: Secondary | ICD-10-CM

## 2022-11-08 DIAGNOSIS — D124 Benign neoplasm of descending colon: Secondary | ICD-10-CM | POA: Diagnosis not present

## 2022-11-08 DIAGNOSIS — F32A Depression, unspecified: Secondary | ICD-10-CM | POA: Diagnosis not present

## 2022-11-08 DIAGNOSIS — K648 Other hemorrhoids: Secondary | ICD-10-CM | POA: Diagnosis not present

## 2022-11-08 DIAGNOSIS — I480 Paroxysmal atrial fibrillation: Secondary | ICD-10-CM | POA: Diagnosis not present

## 2022-11-08 DIAGNOSIS — D128 Benign neoplasm of rectum: Secondary | ICD-10-CM | POA: Insufficient documentation

## 2022-11-08 DIAGNOSIS — Z980 Intestinal bypass and anastomosis status: Secondary | ICD-10-CM | POA: Insufficient documentation

## 2022-11-08 DIAGNOSIS — J45909 Unspecified asthma, uncomplicated: Secondary | ICD-10-CM

## 2022-11-08 DIAGNOSIS — K5730 Diverticulosis of large intestine without perforation or abscess without bleeding: Secondary | ICD-10-CM | POA: Insufficient documentation

## 2022-11-08 DIAGNOSIS — K219 Gastro-esophageal reflux disease without esophagitis: Secondary | ICD-10-CM | POA: Insufficient documentation

## 2022-11-08 DIAGNOSIS — K621 Rectal polyp: Secondary | ICD-10-CM | POA: Diagnosis not present

## 2022-11-08 HISTORY — PX: COLONOSCOPY WITH PROPOFOL: SHX5780

## 2022-11-08 HISTORY — PX: POLYPECTOMY: SHX5525

## 2022-11-08 HISTORY — PX: BIOPSY: SHX5522

## 2022-11-08 LAB — HM COLONOSCOPY

## 2022-11-08 SURGERY — COLONOSCOPY WITH PROPOFOL
Anesthesia: General

## 2022-11-08 MED ORDER — PHENYLEPHRINE HCL (PRESSORS) 10 MG/ML IV SOLN
INTRAVENOUS | Status: DC | PRN
  Administered 2022-11-08: 160 ug via INTRAVENOUS

## 2022-11-08 MED ORDER — PROPOFOL 500 MG/50ML IV EMUL
INTRAVENOUS | Status: DC | PRN
  Administered 2022-11-08: 200 ug/kg/min via INTRAVENOUS

## 2022-11-08 MED ORDER — LACTATED RINGERS IV SOLN: INTRAVENOUS | Status: DC

## 2022-11-08 MED ORDER — PROPOFOL 10 MG/ML IV BOLUS
INTRAVENOUS | Status: DC | PRN
  Administered 2022-11-08: 50 mg via INTRAVENOUS

## 2022-11-08 NOTE — Op Note (Signed)
[facility] Patient Name: Robert Gordon Procedure Date: 11/08/2022 2:04 PM MRN: [id number] Date of Birth: 04/08/1965 Attending MD: Katrinka Blazing , , [id number] CSN: [id number] Age: 57 Admit Type: Outpatient Procedure:                Colonoscopy Indications:              Clinically significant diarrhea of unexplained                            origin Providers:                Katrinka Blazing, Sheran Fava, Lennice Sites                            Technician, Technician, Elinor Parkinson Referring MD:              Medicines:                Monitored Anesthesia Care Complications:            No immediate complications. Estimated Blood Loss:     Estimated blood loss: none. Procedure:                Pre-Anesthesia Assessment:                           - Prior to the procedure, a History and Physical                            was performed, and patient medications, allergies                            and sensitivities were reviewed. The patient's                            tolerance of previous anesthesia was reviewed.                           - The risks and benefits of the procedure and the                            sedation options and risks were discussed with the                            patient. All questions were answered and informed                            consent was obtained.                           - ASA Grade Assessment: II - A patient with mild                            systemic disease.                           After obtaining informed consent, the colonoscope  was passed under direct vision. Throughout the                            procedure, the patient's blood pressure, pulse, and                            oxygen saturations were monitored continuously. The                            [id number]-HQ190L ([id number]) scope was introduced through                            the anus and advanced to the the cecum, identified                             by appendiceal orifice and ileocecal valve. The                            colonoscopy was performed without difficulty. The                            patient tolerated the procedure well. The quality                            of the bowel preparation was good. Scope In: 2:22:47 PM Scope Out: 2:38:35 PM Scope Withdrawal Time: 0 hours 12 minutes 36 seconds  Total Procedure Duration: 0 hours 15 minutes 48 seconds  Findings:      The perianal and digital rectal examinations were normal.      Two sessile polyps were found in the rectum and descending colon. The       polyps were 2 to 4 mm in size. These polyps were removed with a cold       snare. Resection and retrieval were complete.      There was evidence of a prior end-to-side colo-colonic anastomosis in       the sigmoid colon. This was patent and was characterized by healthy       appearing mucosa.      Scattered medium-mouthed and small-mouthed diverticula were found in the       sigmoid colon and descending colon. Biopsies for histology were taken       from the normal colon with a cold forceps from the right colon and left       colon for evaluation of microscopic colitis.      Non-bleeding internal hemorrhoids were found during retroflexion. The       hemorrhoids were small. Impression:               - Two 2 to 4 mm polyps in the rectum and in the                            descending colon, removed with a cold snare.                            Resected and retrieved.                           -  Patent end-to-side colo-colonic anastomosis,                            characterized by healthy appearing mucosa.                           - Diverticulosis in the sigmoid colon and in the                            descending colon. Biopsied.                           - Non-bleeding internal hemorrhoids. Moderate Sedation:      Per Anesthesia Care Recommendation:           - Discharge patient to home  (ambulatory).                           - Resume previous diet.                           - Await pathology results.                           - Repeat colonoscopy for surveillance based on                            pathology results. Procedure Code(s):        --- Professional ---                           [id number], Colonoscopy, flexible; with removal of                            tumor(s), polyp(s), or other lesion(s) by snare                            technique                           45380, 59, Colonoscopy, flexible; with biopsy,                            single or multiple Diagnosis Code(s):        --- Professional ---                           D12.8, Benign neoplasm of rectum                           D12.4, Benign neoplasm of descending colon                           Z98.0, Intestinal bypass and anastomosis status                           K64.8, Other hemorrhoids  R19.7, Diarrhea, unspecified                           K57.30, Diverticulosis of large intestine without                            perforation or abscess without bleeding CPT copyright 2022 American Medical Association. All rights reserved. The codes documented in this report are preliminary and upon coder review may  be revised to meet current compliance requirements. Katrinka Blazing, MD Katrinka Blazing,  11/08/2022 2:45:24 PM This report has been signed electronically. Number of Addenda: 0

## 2022-11-08 NOTE — H&P (Signed)
Robert Gordon is an 57 y.o. male.   Chief Complaint: chronic diarrhea HPI: Robert Gordon is a 57 y.o. male with past medical history of arthritis, anemia, diverticulitis, GERD, HLD, IBS, Paroxysmal A fib, who comes for evaluation of chronic diarrhea.  Patient reports that every time he has something to eat he will have a bowel movement.  States the stools are loose.  Has 3 bowel movements per day on average.  No family history of colon cancer.  The patient denies having any nausea, vomiting, fever, chills, hematochezia, melena, hematemesis, abdominal distention, abdominal pain, jaundice, pruritus or weight loss.   Past Medical History:  Diagnosis Date   Arthritis    Asthma    Avascular necrosis of bones of both hips (HCC)    Depression    Diverticulitis    s/p sigmoid colectomy   GERD (gastroesophageal reflux disease)    Hyperlipidemia    IBS (irritable bowel syndrome)    Paroxysmal atrial fibrillation [facility])    Diagnosed March 2022   Sleep apnea     Past Surgical History:  Procedure Laterality Date   COLONOSCOPY N/A 10/02/2012   Procedure: COLONOSCOPY;  Surgeon: West Bali, MD;  Location: [facility];  Service: Endoscopy;  Laterality: N/A;  11:00   HIP SURGERY Left    LAPAROSCOPIC SIGMOID COLECTOMY  09/11/2006   SIGMOIDOSCOPY  08/14/2006   EAV:[id number] diverticula seen beginning 20 cm from the anal verge and extending to 50 cm from the anal verge/Normal retroflexed view of the rectum.   TONSILLECTOMY      Family History  Problem Relation Age of Onset   Colon polyps Mother        pre-cancerous   Social History:  reports that he quit smoking about 4 years ago. His smoking use included cigarettes. He has been exposed to tobacco smoke. He has never used smokeless tobacco. He reports that he does not drink alcohol and does not use drugs.  Allergies:  Allergies  Allergen Reactions   Dextromethorphan Other (See Comments)    Thinks it may be a causative agent in the  asthma attacks, unsure. Had an attack one time after taking cough medicine containing dextromethorphan.    Dm [Dextromethorphan-Guaifenesin] Other (See Comments)    Hurts chest.   Lactaid [Tilactase] Diarrhea, Nausea And Vomiting and Other (See Comments)    Abdominal cramping/bloating    Medications Prior to Admission  Medication Sig Dispense Refill   albuterol (VENTOLIN HFA) 108 (90 Base) MCG/ACT inhaler Inhale 1 puff into the lungs every 6 (six) hours as needed for wheezing or shortness of breath.     atorvastatin (LIPITOR) 40 MG tablet Take 20 mg by mouth at bedtime.     buPROPion ER (WELLBUTRIN SR) 100 MG 12 hr tablet Take 1 tablet by mouth daily.     Cholecalciferol 50 MCG (2000 UT) TABS Take 4,000 Units by mouth daily.     diltiazem (CARDIZEM CD) 120 MG 24 hr capsule TAKE 1 CAPSULE(120 MG) BY MOUTH DAILY 90 capsule 0   dronedarone (MULTAQ) 400 MG tablet Take 1 tablet (400 mg total) by mouth 2 (two) times daily with a meal. Appt req for refill [id number] 60 tablet 0   gabapentin (NEURONTIN) 100 MG capsule Take 100 mg by mouth daily as needed (pain).     montelukast (SINGULAIR) 10 MG tablet Take 10 mg by mouth at bedtime.     Tiotropium Bromide Monohydrate 2.5 MCG/ACT AERS Inhale into the lungs. Inhale 2 inhalation every day  albuterol (PROVENTIL) (2.5 MG/3ML) 0.083% nebulizer solution Take 2.5 mg by nebulization every 4 (four) hours as needed for shortness of breath. For shortness of breath     diltiazem (CARDIZEM) 30 MG tablet Take 1 tablet (30 mg total) by mouth as needed. For palpitations.  Take 1 tablet if palpitations do not stop and 1 hour.  Take second tablet.  If they do not stop after that seek medical attention. 30 tablet 6   predniSONE (DELTASONE) 20 MG tablet Take 20 mg by mouth. As needed for acute exacerbation of asthma (Patient not taking: Reported on 08/25/2022)     promethazine (PHENERGAN) 25 MG tablet Take 1 tablet (25 mg total) by mouth every 8 (eight) hours as needed  for nausea or vomiting. Do not take with alcohol or while driving or operating heavy machinery.  May cause drowsiness. (Patient not taking: Reported on 08/25/2022) 30 tablet 0    No results found for this or any previous visit (from the past 48 hour(s)). No results found.  Review of Systems  Gastrointestinal:  Positive for diarrhea.  All other systems reviewed and are negative.   Blood pressure 113/76, pulse 76, temperature 98.4 F (36.9 C), resp. rate 13, SpO2 100%. Physical Exam  GENERAL: The patient is AO x3, in no acute distress. HEENT: Head is normocephalic and atraumatic. EOMI are intact. Mouth is well hydrated and without lesions. NECK: Supple. No masses LUNGS: Clear to auscultation. No presence of rhonchi/wheezing/rales. Adequate chest expansion HEART: RRR, normal s1 and s2. ABDOMEN: Soft, nontender, no guarding, no peritoneal signs, and nondistended. BS +. No masses. EXTREMITIES: Without any cyanosis, clubbing, rash, lesions or edema. NEUROLOGIC: AOx3, no focal motor deficit. SKIN: no jaundice, no rashes  Assessment/Plan Robert Gordon is a 57 y.o. male with past medical history of arthritis, anemia, diverticulitis, GERD, HLD, IBS, Paroxysmal A fib, who comes for evaluation of chronic diarrhea.  Will proceed with colonoscopy.  Dolores Frame, MD 11/08/2022, 2:17 PM

## 2022-11-08 NOTE — Discharge Instructions (Signed)
You are being discharged to home.  Resume your previous diet.  We are waiting for your pathology results.  Your physician has recommended a repeat colonoscopy for surveillance based on pathology results.  

## 2022-11-08 NOTE — Transfer of Care (Signed)
Immediate Anesthesia Transfer of Care Note  Patient: Robert Gordon  Procedure(s) Performed: COLONOSCOPY WITH PROPOFOL BIOPSY POLYPECTOMY  Patient Location: Short Stay  Anesthesia Type:General  Level of Consciousness: awake, alert , oriented, and patient cooperative  Airway & Oxygen Therapy: Patient Spontanous Breathing  Post-op Assessment: Report given to RN, Post -op Vital signs reviewed and stable, and Patient moving all extremities X 4  Post vital signs: Reviewed and stable  Last Vitals:  Vitals Value Taken Time  BP    Temp    Pulse    Resp    SpO2      Last Pain:  Vitals:   11/08/22 1418  PainSc: 4          Complications: No notable events documented.

## 2022-11-08 NOTE — Anesthesia Preprocedure Evaluation (Signed)
Anesthesia Evaluation  Patient identified by MRN, date of birth, ID band Patient awake    Reviewed: Allergy & Precautions, H&P , NPO status , Patient's Chart, lab work & pertinent test results, reviewed documented beta blocker date and time   Airway Mallampati: II  TM Distance: >3 FB Neck ROM: full    Dental no notable dental hx.    Pulmonary neg pulmonary ROS, asthma , sleep apnea , former smoker   Pulmonary exam normal breath sounds clear to auscultation       Cardiovascular Exercise Tolerance: Good + dysrhythmias Atrial Fibrillation  Rhythm:regular Rate:Normal     Neuro/Psych  PSYCHIATRIC DISORDERS  Depression    negative neurological ROS  negative psych ROS   GI/Hepatic negative GI ROS, Neg liver ROS,GERD  ,,  Endo/Other  negative endocrine ROS    Renal/GU negative Renal ROS  negative genitourinary   Musculoskeletal   Abdominal   Peds  Hematology negative hematology ROS (+)   Anesthesia Other Findings   Reproductive/Obstetrics negative OB ROS                             Anesthesia Physical Anesthesia Plan  ASA: 3  Anesthesia Plan: General   Post-op Pain Management:    Induction:   PONV Risk Score and Plan: Propofol infusion  Airway Management Planned:   Additional Equipment:   Intra-op Plan:   Post-operative Plan:   Informed Consent: I have reviewed the patients History and Physical, chart, labs and discussed the procedure including the risks, benefits and alternatives for the proposed anesthesia with the patient or authorized representative who has indicated his/her understanding and acceptance.     Dental Advisory Given  Plan Discussed with: CRNA  Anesthesia Plan Comments:        Anesthesia Quick Evaluation

## 2022-11-09 NOTE — Anesthesia Postprocedure Evaluation (Signed)
Anesthesia Post Note  Patient: Robert Gordon  Procedure(s) Performed: COLONOSCOPY WITH PROPOFOL BIOPSY POLYPECTOMY  Patient location during evaluation: Phase II Anesthesia Type: General Level of consciousness: awake Pain management: pain level controlled Vital Signs Assessment: post-procedure vital signs reviewed and stable Respiratory status: spontaneous breathing and respiratory function stable Cardiovascular status: blood pressure returned to baseline and stable Postop Assessment: no headache and no apparent nausea or vomiting Anesthetic complications: no Comments: Late entry   No notable events documented.   Last Vitals:  Vitals:   11/08/22 1400 11/08/22 1443  BP: 113/76 117/71  Pulse: 76 68  Resp: 13 20  Temp:  36.5 C  SpO2: 100% 100%    Last Pain:  Vitals:   11/08/22 1443  PainSc: 0-No pain                 Windell Norfolk

## 2022-11-11 ENCOUNTER — Encounter (HOSPITAL_COMMUNITY): Payer: Self-pay | Admitting: Gastroenterology

## 2022-11-17 LAB — SURGICAL PATHOLOGY

## 2022-11-18 ENCOUNTER — Encounter (INDEPENDENT_AMBULATORY_CARE_PROVIDER_SITE_OTHER): Payer: Self-pay | Admitting: *Deleted

## 2022-12-15 ENCOUNTER — Ambulatory Visit (INDEPENDENT_AMBULATORY_CARE_PROVIDER_SITE_OTHER): Payer: No Typology Code available for payment source | Admitting: Gastroenterology

## 2022-12-15 ENCOUNTER — Encounter (INDEPENDENT_AMBULATORY_CARE_PROVIDER_SITE_OTHER): Payer: Self-pay | Admitting: Gastroenterology

## 2022-12-15 VITALS — BP 112/74 | HR 79 | Temp 97.9°F | Ht 69.0 in | Wt 221.5 lb

## 2022-12-15 DIAGNOSIS — K580 Irritable bowel syndrome with diarrhea: Secondary | ICD-10-CM

## 2022-12-15 DIAGNOSIS — R140 Abdominal distension (gaseous): Secondary | ICD-10-CM

## 2022-12-15 NOTE — Patient Instructions (Signed)
Please continue to avoid trigger foods I am providing the low FODMAP diet which can be helpful in eliminating foods that tend to cause worsening diarrhea/abdominal discomfort You can try taking lactaid prior to eating dairy and can also try over the counter IB gard to help with bloating/gas  Follow up 6 months  It was a pleasure to see you today. I want to create trusting relationships with patients and provide genuine, compassionate, and quality care. I truly value your feedback! please be on the lookout for a survey regarding your visit with me today. I appreciate your input about our visit and your time in completing this!    Harsh Trulock L. Jeanmarie Hubert, MSN, APRN, AGNP-C Adult-Gerontology Nurse Practitioner [facility]

## 2022-12-15 NOTE — Progress Notes (Addendum)
Referring Provider: [facility] Primary Care Physician:  [facility] Primary GI Physician: Dr. Levon Hedger   Chief Complaint  Patient presents with   Diarrhea    Follow up on diarrhea. States he is doing since changing diet.    HPI:   Robert Gordon is a 57 y.o. male with past medical history of arthritis, anemia, diverticulitis, GERD, HLD, IBS, Paroxysmal A fib   Patient presenting today for follow up of diarrhea  Last seen June 2024, at that time having diarrhea, usually with looser stools, sometimes watery. History of constipation, taking linzess years back. Having some fecal urgency. Bloating with breads and diarrhea worse with dairy.   Recommended celiac testing, colonoscopy  Celiac panel negative.  Present: States he is doing better. Has made changes to his diet. Cutting down dairy intake and breads as these seem to make him more gassy and bloated.  He reports he has not had really bad diarrhea for a couple of weeks now. Has had some loose stools, he is having solid stools as well.  He has history of IBS. Reports dealing with depression and tends to eat more so he is trying to do better with this. No red flag symptoms. Patient denies melena, hematochezia, nausea, vomiting, constipation, dysphagia, odyonophagia, early satiety or weight loss.    Last Colonoscopy: 10/2022- Two 2 to 4 mm polyps in the rectum and in the                            descending colon, removed with a cold snare.                            Resected and retrieved.                           - Patent end-to-side colo-colonic anastomosis,                            characterized by healthy appearing mucosa.                           - Diverticulosis in the sigmoid colon and in the                            descending colon. Biopsied.                           - Non-bleeding internal    A. COLON, RANDOM, BIOPSY: Colonic mucosa with no significant pathologic changes. No  microscopic colitis, active inflammation or granulomas.   B. COLON, DESCENDING, RECTAL, POLYPECTOMY: Tubular adenoma without high grade dysplasia. Hyperplastic polyp (s).    Recommendations:  Repeat colonoscopy 7 years   Past Medical History:  Diagnosis Date   Arthritis    Asthma    Avascular necrosis of bones of both hips (HCC)    Depression    Diverticulitis    s/p sigmoid colectomy   GERD (gastroesophageal reflux disease)    Hyperlipidemia    IBS (irritable bowel syndrome)    Paroxysmal atrial fibrillation [facility])    Diagnosed March 2022   Sleep apnea     Past Surgical History:  Procedure Laterality Date  BIOPSY  11/08/2022   Procedure: BIOPSY;  Surgeon: Dolores Frame, MD;  [facility];  Service: Gastroenterology;;   COLONOSCOPY N/A 10/02/2012   Procedure: COLONOSCOPY;  Surgeon: West Bali, MD;  Location: [facility];  Service: Endoscopy;  Laterality: N/A;  11:00   COLONOSCOPY WITH PROPOFOL N/A 11/08/2022   Procedure: COLONOSCOPY WITH PROPOFOL;  Surgeon: Dolores Frame, MD;  Location: [facility];  Service: Gastroenterology;  Laterality: N/A;  2:00pm;asa 3   HIP SURGERY Left    LAPAROSCOPIC SIGMOID COLECTOMY  09/11/2006   POLYPECTOMY  11/08/2022   Procedure: POLYPECTOMY;  Surgeon: Marguerita Merles, Reuel Boom, MD;  Location: [facility];  Service: Gastroenterology;;   SIGMOIDOSCOPY  08/14/2006   [id number] diverticula seen beginning 20 cm from the anal verge and extending to 50 cm from the anal verge/Normal retroflexed view of the rectum.   TONSILLECTOMY      Current Outpatient Medications  Medication Sig Dispense Refill   albuterol (PROVENTIL) (2.5 MG/3ML) 0.083% nebulizer solution Take 2.5 mg by nebulization every 4 (four) hours as needed for shortness of breath. For shortness of breath     albuterol (VENTOLIN HFA) 108 (90 Base) MCG/ACT inhaler Inhale 1 puff into the lungs every 6 (six) hours as needed for wheezing or shortness  of breath.     atorvastatin (LIPITOR) 40 MG tablet Take 20 mg by mouth at bedtime.     buPROPion ER (WELLBUTRIN SR) 100 MG 12 hr tablet Take 1 tablet by mouth daily.     Cholecalciferol 50 MCG (2000 UT) TABS Take 4,000 Units by mouth daily.     diltiazem (CARDIZEM CD) 120 MG 24 hr capsule TAKE 1 CAPSULE(120 MG) BY MOUTH DAILY 90 capsule 0   diltiazem (CARDIZEM) 30 MG tablet Take 1 tablet (30 mg total) by mouth as needed. For palpitations.  Take 1 tablet if palpitations do not stop and 1 hour.  Take second tablet.  If they do not stop after that seek medical attention. 30 tablet 6   dronedarone (MULTAQ) 400 MG tablet Take 1 tablet (400 mg total) by mouth 2 (two) times daily with a meal. Appt req for refill [id number] 60 tablet 0   gabapentin (NEURONTIN) 100 MG capsule Take 100 mg by mouth daily as needed (pain).     montelukast (SINGULAIR) 10 MG tablet Take 10 mg by mouth at bedtime.     predniSONE (DELTASONE) 20 MG tablet Take 20 mg by mouth. As needed for acute exacerbation of asthma     promethazine (PHENERGAN) 25 MG tablet Take 1 tablet (25 mg total) by mouth every 8 (eight) hours as needed for nausea or vomiting. Do not take with alcohol or while driving or operating heavy machinery.  May cause drowsiness. 30 tablet 0   Tiotropium Bromide Monohydrate 2.5 MCG/ACT AERS Inhale into the lungs. Inhale 2 inhalation every day     traZODone (DESYREL) 50 MG tablet Take 50 mg by mouth at bedtime.     No current facility-administered medications for this visit.    Allergies as of 12/15/2022 - Review Complete 12/15/2022  Allergen Reaction Noted   Dextromethorphan Other (See Comments) 02/05/2011   Dm [dextromethorphan-guaifenesin] Other (See Comments) 09/24/2010   Lactaid [tilactase] Diarrhea, Nausea And Vomiting, and Other (See Comments) 09/05/2012    Family History  Problem Relation Age of Onset   Colon polyps Mother        pre-cancerous    Social History   Socioeconomic History   Marital  status:  Married    Spouse name: Not on file   Number of children: Not on file   Years of education: Not on file   Highest education level: Not on file  Occupational History   Occupation: Social Printmaker: [employer]  Tobacco Use   Smoking status: Former    Current packs/day: 0.00    Types: Cigarettes    Quit date: 2020    Years since quitting: 4.7    Passive exposure: Past   Smokeless tobacco: Never  Vaping Use   Vaping status: Never Used  Substance and Sexual Activity   Alcohol use: No   Drug use: No   Sexual activity: Not on file  Other Topics Concern   Not on file  Social History Narrative   Not on file   Social Determinants of Health   Financial Resource Strain: Not on file  Food Insecurity: Not on file  Transportation Needs: Not on file  Physical Activity: Not on file  Stress: Not on file  Social Connections: Unknown (07/27/2021)   Received from [facility]   Social Network    Social Network: Not on file   Review of systems General: negative for malaise, night sweats, fever, chills, weight loss Neck: Negative for lumps, goiter, pain and significant neck swelling Resp: Negative for cough, wheezing, dyspnea at rest CV: Negative for chest pain, leg swelling, palpitations, orthopnea GI: denies melena, hematochezia, nausea, vomiting, constipation, dysphagia, odyonophagia, early satiety or unintentional weight loss. +diarrhea +bloating MSK: Negative for joint pain or swelling, back pain, and muscle pain. Derm: Negative for itching or rash Psych: Denies depression, anxiety, memory loss, confusion. No homicidal or suicidal ideation.  Heme: Negative for prolonged bleeding, bruising easily, and swollen nodes. Endocrine: Negative for cold or heat intolerance, polyuria, polydipsia and goiter. Neuro: negative for tremor, gait imbalance, syncope and seizures. The remainder of the review of systems is  noncontributory.  Physical Exam: BP 112/74 (BP Location: Right Arm, Patient Position: Sitting, Cuff Size: Large)   Pulse 79   Temp 97.9 F (36.6 C) (Oral)   Ht 5\' 9"  (1.753 m)   Wt 221 lb 8 oz (100.5 kg)   BMI 32.71 kg/m  General:   Alert and oriented. No distress noted. Pleasant and cooperative.  Head:  Normocephalic and atraumatic. Eyes:  Conjuctiva clear without scleral icterus. Mouth:  Oral mucosa pink and moist. Good dentition. No lesions. Heart: Normal rate and rhythm, s1 and s2 heart sounds present.  Lungs: Clear lung sounds in all lobes. Respirations equal and unlabored. Abdomen:  +BS, soft, non-tender and non-distended. No rebound or guarding. No HSM or masses noted. Derm: No palmar erythema or jaundice Msk:  Symmetrical without gross deformities. Normal posture. Extremities:  Without edema. Neurologic:  Alert and  oriented x4 Psych:  Alert and cooperative. Normal mood and affect.  Invalid input(s): "6 MONTHS"   ASSESSMENT: Robert Gordon is a 57 y.o. male presenting today for follow up of diarrhea  Patient with diarrhea and bloating, improved some with cutting out breads, dairy for the most part. Celiac panel negative. Colonoscopy with negative random colonic biopsies. Suspect symptoms are secondary to his IBS, he may have some gluten sensitivity given more bloating/diarrhea with breads/possible lactose intolerance, these go hand in hand with IBS.  I explained indications of IBS with the patient to include hypersensitivity of the bowels that is often heavily influenced by certain food/drink triggers and emotional/mental triggers. We discussed the importance of keeping  a food journal x2-3 weeks and utilizing the low FODMAP guide to help determine specific trigger foods and increasing foods that tend to be more well tolerated. Recommend continuing to avoid trigger foods, can use lactaid if he is really craving ice cream or something dairy based. He can also try IB gard for  bloating as well.   Patient had questions about pathology results of recent colonoscopy which I discussed with him. Will plan to repeat colonoscopy in 7 years due to presence of 1 TA.   PLAN:  Continue to avoid breads/dairy  2. Can do lactaid if eating dairy  3. Low FODMAP and food/stool journal 4. IB gard for bloating  All questions were answered, patient verbalized understanding and is in agreement with plan as outlined above.    Follow Up: 6 months   Melani Brisbane L. Jeanmarie Hubert, MSN, APRN, AGNP-C Adult-Gerontology Nurse Practitioner [facility]  I have reviewed the note and agree with the APP's assessment as described in this progress note  Katrinka Blazing, MD Gastroenterology and Hepatology [facility]

## 2023-01-22 ENCOUNTER — Emergency Department (HOSPITAL_COMMUNITY)
Admission: EM | Admit: 2023-01-22 | Discharge: 2023-01-22 | Disposition: A | Discharge status: Home / Self Care | Payer: No Typology Code available for payment source | Attending: Emergency Medicine | Admitting: Emergency Medicine

## 2023-01-22 ENCOUNTER — Other Ambulatory Visit: Payer: Self-pay

## 2023-01-22 DIAGNOSIS — R002 Palpitations: Secondary | ICD-10-CM | POA: Insufficient documentation

## 2023-01-22 DIAGNOSIS — M5412 Radiculopathy, cervical region: Secondary | ICD-10-CM | POA: Insufficient documentation

## 2023-01-22 DIAGNOSIS — R200 Anesthesia of skin: Secondary | ICD-10-CM | POA: Diagnosis present

## 2023-01-22 LAB — BASIC METABOLIC PANEL
Anion gap: 9 (ref 5–15)
BUN: 13 mg/dL (ref 6–20)
CO2: 24 mmol/L (ref 22–32)
Calcium: 8.9 mg/dL (ref 8.9–10.3)
Chloride: 103 mmol/L (ref 98–111)
Creatinine, Ser: 1.2 mg/dL (ref 0.61–1.24)
GFR, Estimated: >60 mL/min (ref >60)
Glucose, Bld: 111 mg/dL — ABNORMAL HIGH (ref 70–99)
Potassium: 3.8 mmol/L (ref 3.5–5.1)
Sodium: 136 mmol/L (ref 135–145)

## 2023-01-22 LAB — CBC WITH DIFFERENTIAL/PLATELET
Abs Immature Granulocytes: 0.03 10*3/uL (ref 0.00–0.07)
Basophils Absolute: 0.1 10*3/uL (ref 0.0–0.1)
Basophils Relative: 1 %
Eosinophils Absolute: 0.5 10*3/uL (ref 0.0–0.5)
Eosinophils Relative: 5 %
HCT: 45 % (ref 39.0–52.0)
Hemoglobin: 15 g/dL (ref 13.0–17.0)
Immature Granulocytes: 0 %
Lymphocytes Relative: 49 %
Lymphs Abs: 4.3 10*3/uL — ABNORMAL HIGH (ref 0.7–4.0)
MCH: 30.4 pg (ref 26.0–34.0)
MCHC: 33.3 g/dL (ref 30.0–36.0)
MCV: 91.1 fL (ref 80.0–100.0)
Monocytes Absolute: 0.6 10*3/uL (ref 0.1–1.0)
Monocytes Relative: 7 %
Neutro Abs: 3.4 10*3/uL (ref 1.7–7.7)
Neutrophils Relative %: 38 %
Platelets: 247 10*3/uL (ref 150–400)
RBC: 4.94 MIL/uL (ref 4.22–5.81)
RDW: 13.1 % (ref 11.5–15.5)
WBC: 9 10*3/uL (ref 4.0–10.5)
nRBC: 0 % (ref 0.0–0.2)

## 2023-01-22 LAB — TROPONIN I (HIGH SENSITIVITY): Troponin I (High Sensitivity): 5 ng/L (ref <18)

## 2023-01-22 MED ORDER — KETOROLAC TROMETHAMINE 30 MG/ML IJ SOLN
15.0000 mg | Freq: Once | INTRAMUSCULAR | Status: AC
  Administered 2023-01-22: 15 mg via INTRAVENOUS
  Filled 2023-01-22: qty 1

## 2023-01-22 MED ORDER — METHOCARBAMOL 500 MG PO TABS: 500.0000 mg | ORAL_TABLET | Freq: Two times a day (BID) | ORAL | 0 refills | Status: AC

## 2023-01-22 MED ORDER — NAPROXEN 500 MG PO TABS: 500.0000 mg | ORAL_TABLET | Freq: Two times a day (BID) | ORAL | 0 refills | Status: AC

## 2023-01-22 NOTE — ED Provider Notes (Signed)
[place] EMERGENCY DEPARTMENT AT [facility]  Provider Note  CSN: [id number] Arrival date & time: 01/22/23 0051  History Chief Complaint  Patient presents with   Arm numbness    Robert Gordon is a 57 y.o. male with history of pAF previously on Multaq and Cardizem stopped taking them several months ago. He reports in the last couple of weeks he has felt his heart was feeling weird when he would lie down at night, not racing but maybe skipping beats. He attributed this to afib episodes but not confirmed with a monitor and so he started taking his cardizem intermittently. He reports earlier this evening he began to feel a 'crick' in his left neck and some tingling in his L arm. No loss of sensation or weakness. He became concerned that this could be symptoms of a stroke and so he came to the ED. He is not having any chest pain or SOB now. He has been taking some liquid hydrocodone for his back recently but has not had any known neck problems.    Home Medications Prior to Admission medications   Medication Sig Start Date End Date Taking? Authorizing Provider  methocarbamol (ROBAXIN) 500 MG tablet Take 1 tablet (500 mg total) by mouth 2 (two) times daily. 01/22/23  Yes Pollyann Savoy, MD  naproxen (NAPROSYN) 500 MG tablet Take 1 tablet (500 mg total) by mouth 2 (two) times daily. 01/22/23  Yes Pollyann Savoy, MD  albuterol (PROVENTIL) (2.5 MG/3ML) 0.083% nebulizer solution Take 2.5 mg by nebulization every 4 (four) hours as needed for shortness of breath. For shortness of breath    [provider]  albuterol (VENTOLIN HFA) 108 (90 Base) MCG/ACT inhaler Inhale 1 puff into the lungs every 6 (six) hours as needed for wheezing or shortness of breath.    [provider]  atorvastatin (LIPITOR) 40 MG tablet Take 20 mg by mouth at bedtime.    [provider]  buPROPion ER (WELLBUTRIN SR) 100 MG 12 hr tablet Take 1 tablet by mouth daily. 07/23/20    [provider]  Cholecalciferol 50 MCG (2000 UT) TABS Take 4,000 Units by mouth daily. 07/19/19   [provider]  diltiazem (CARDIZEM CD) 120 MG 24 hr capsule TAKE 1 CAPSULE(120 MG) BY MOUTH DAILY 10/10/22   Eustace Pen, PA-C  diltiazem (CARDIZEM) 30 MG tablet Take 1 tablet (30 mg total) by mouth as needed. For palpitations.  Take 1 tablet if palpitations do not stop and 1 hour.  Take second tablet.  If they do not stop after that seek medical attention. 03/24/21   Jonelle Sidle, MD  dronedarone (MULTAQ) 400 MG tablet Take 1 tablet (400 mg total) by mouth 2 (two) times daily with a meal. Appt req for refill [id number] 08/26/22   Fenton, Clint R, PA  gabapentin (NEURONTIN) 100 MG capsule Take 100 mg by mouth daily as needed (pain). 07/23/20   [provider]  montelukast (SINGULAIR) 10 MG tablet Take 10 mg by mouth at bedtime.    [provider]  predniSONE (DELTASONE) 20 MG tablet Take 20 mg by mouth. As needed for acute exacerbation of asthma    [provider]  promethazine (PHENERGAN) 25 MG tablet Take 1 tablet (25 mg total) by mouth every 8 (eight) hours as needed for nausea or vomiting. Do not take with alcohol or while driving or operating heavy machinery.  May cause drowsiness. 12/16/21   Valentino Nose, NP  Tiotropium Bromide Monohydrate 2.5 MCG/ACT AERS Inhale into the lungs. Inhale 2 inhalation every day    [provider]  traZODone (DESYREL) 50 MG tablet Take 50 mg by mouth at bedtime. 10/17/22   [provider]     Allergies    Dextromethorphan, Dm [dextromethorphan-guaifenesin], and Lactaid [tilactase]   Review of Systems   Review of Systems Please see HPI for pertinent positives and negatives  Physical Exam BP 133/81 (BP Location: Left Arm)   Pulse 75   Temp 97.7 F (36.5 C) (Oral)   Ht 5\' 9"  (1.753 m)   Wt 100.7 kg   SpO2 97%   BMI 32.78 kg/m   Physical Exam Vitals and nursing note reviewed.   Constitutional:      Appearance: Normal appearance.  HENT:     Head: Normocephalic and atraumatic.     Nose: Nose normal.     Mouth/Throat:     Mouth: Mucous membranes are moist.  Eyes:     Extraocular Movements: Extraocular movements intact.     Conjunctiva/sclera: Conjunctivae normal.  Cardiovascular:     Rate and Rhythm: Normal rate.  Pulmonary:     Effort: Pulmonary effort is normal.     Breath sounds: Normal breath sounds.  Abdominal:     General: Abdomen is flat.     Palpations: Abdomen is soft.     Tenderness: There is no abdominal tenderness.  Musculoskeletal:        General: Tenderness (L cervical paraspinal muscles) present. No swelling. Normal range of motion.     Cervical back: Neck supple.  Skin:    General: Skin is warm and dry.  Neurological:     General: No focal deficit present.     Mental Status: He is alert and oriented to person, place, and time.     Cranial Nerves: No cranial nerve deficit.     Sensory: No sensory deficit.     Motor: No weakness.     Deep Tendon Reflexes: Reflexes normal.  Psychiatric:        Mood and Affect: Mood normal.     ED Results / Procedures / Treatments   EKG EKG Interpretation Date/Time:  Sunday January 22 2023 01:03:56 EST Ventricular Rate:  74 PR Interval:  134 QRS Duration:  95 QT Interval:  392 QTC Calculation: 435 R Axis:   73  Text Interpretation: Sinus rhythm Normal ECG No significant change since last tracing Confirmed by Susy Frizzle [phone]) on 01/22/2023 1:06:09 AM  Procedures Procedures  Medications Ordered in the ED Medications  ketorolac (TORADOL) 30 MG/ML injection 15 mg (15 mg Intravenous Given 01/22/23 0142)    Initial Impression and Plan  Patient with history of pAF here with some recent palpitations and tonight tingling in his L arm. He is currently in NSR, will check labs and monitor for signs of dysrhythmia. His L arm tingling is more of a radiculopathy/paresthesia than a neuro  deficit; he has a normal neuro exam now. Suspect a muscle spasm in neck is responsible. Will give a dose of toradol while awaiting labs.   ED Course   Clinical Course as of 01/22/23 0235  Wynelle Link Jan 22, 2023  0148 CBC is unremarkable.  [CS]  0232 BMP and Trop are neg. Patient's pain is improved with Toradol. He has had occasional PVCs on monitor during his ED stay which may account for his palpitation symptoms, recommend he follow up with Cardiology for consideration of ZIO patch if symptoms persist. Otherwise his exam  is benign, no focal neuro deficits to suggest CVA. Plan discharge with Rx for Naprosyn, robaxin for his paresthesia. RTED for any other concerns. [CS]    Clinical Course User Index [CS] Pollyann Savoy, MD     MDM Rules/Calculators/A&P Medical Decision Making Given presenting complaint, I considered that admission might be necessary. After review of results from ED lab and/or imaging studies, admission to the hospital is not indicated at this time.    Amount and/or Complexity of Data Reviewed Labs: ordered. Decision-making details documented in ED Course. ECG/medicine tests: ordered and independent interpretation performed. Decision-making details documented in ED Course.  Risk Prescription drug management. Decision regarding hospitalization.     Final Clinical Impression(s) / ED Diagnoses Final diagnoses:  Palpitations  Cervical radiculopathy    Rx / DC Orders ED Discharge Orders          Ordered    Ambulatory referral to Cardiology       Comments: If you have not heard from the Cardiology office within the next 72 hours please call [phone].   01/22/23 0234    naproxen (NAPROSYN) 500 MG tablet  2 times daily        01/22/23 0234    methocarbamol (ROBAXIN) 500 MG tablet  2 times daily        01/22/23 0234             Pollyann Savoy, MD 01/22/23 [phone]

## 2023-01-22 NOTE — ED Triage Notes (Signed)
Patient from home for L arm numbness that started just prior to arrival. Patient has history of A.fib and was not taking his medication until 2 days ago. States when he lays down, his heart feels like it is racing. While lying down, his L arm went numb/"feeling weird" about 1 hour ago. Upon arrival to ER, patient is alert and oriented, ambu; denies CP or SOB

## 2023-02-22 ENCOUNTER — Ambulatory Visit: Payer: No Typology Code available for payment source | Attending: Student | Admitting: Student

## 2023-02-22 ENCOUNTER — Encounter: Payer: Self-pay | Admitting: Student

## 2023-02-22 VITALS — BP 108/70 | HR 98 | Ht 69.0 in | Wt 222.6 lb

## 2023-02-22 DIAGNOSIS — E782 Mixed hyperlipidemia: Secondary | ICD-10-CM

## 2023-02-22 DIAGNOSIS — I480 Paroxysmal atrial fibrillation: Secondary | ICD-10-CM

## 2023-02-22 DIAGNOSIS — Z79899 Other long term (current) drug therapy: Secondary | ICD-10-CM

## 2023-02-22 MED ORDER — MULTAQ 400 MG PO TABS: 400.0000 mg | ORAL_TABLET | Freq: Two times a day (BID) | ORAL | 3 refills | Status: DC

## 2023-02-22 MED ORDER — DILTIAZEM HCL ER COATED BEADS 120 MG PO CP24: 120.0000 mg | ORAL_CAPSULE | Freq: Every day | ORAL | Status: AC

## 2023-02-22 NOTE — Progress Notes (Signed)
Cardiology Office Note    Date:  02/22/2023  ID:  PRESLEY GOOSMAN, DOB 11-Jul-1965, MRN [id number] Cardiologist: Nona Dell, MD    History of Present Illness:    Robert Gordon is a 57 y.o. male with past medical history of paroxysmal atrial fibrillation/atypical atrial flutter, GERD, IBS, OSA and HLD who presents to the office today for follow-up from recent palpitations.  He was examined by Rudi Coco, NP with the [facility] in 05/2021 and denied any recent chest pain or palpitations at that time. He was continued on Multaq 400 mg twice daily along with Cardizem CD 120 mg daily. He had not been on anticoagulation given his CHA2DS2-VASc score of 0 and was recommended that if he was referred for atrial fibrillation ablation in the future, would need to be on anticoagulation for at least 3 weeks ahead of the procedure and 3 months afterwards.  He most recently presented to [facility] ED on 01/22/2023 for evaluation of palpitations. His EKG showed normal sinus rhythm with no acute ST changes. He did have occasional PVC's by review of the notes and it was recommend to follow-up with Cardiology for consideration of a Zio patch. No changes were made to his cardiac medications at that time.  In talking with the patient today, he reports having more frequent palpitations over the past few months which are typically at night but can occur during the day. Describes this as his heart racing and feels like he is running a marathon while sitting still. Symptoms typically spontaneously resolve. He reports being without Multaq and he thought the clinic might have wanted him to stop this since refills were not sent in but by review of records, it was due to his last visit being in 05/2021. [facility].  Was previously feeling well with this with palpitations were overall well-controlled. He denies any specific exertional chest pain, orthopnea or PND. Does  report intermittent dyspnea on exertion which is typically worse when walking outside and he is unsure if this is due to asthma or another cause. He does try to limit his caffeine intake and does not consume alcohol.  Studies Reviewed:   EKG: EKG is not ordered today (no palpitations today per his report).  EKG from 01/22/2023 is reviewed and shows NSR, HR 74 with no acute ST changes.   Echocardiogram: 06/2021 IMPRESSIONS     1. Left ventricular ejection fraction, by estimation, is 55 to 60%. The  left ventricle has normal function. The left ventricle has no regional  wall motion abnormalities. Left ventricular diastolic parameters were  normal.   2. Right ventricular systolic function is normal. The right ventricular  size is normal. Tricuspid regurgitation signal is inadequate for assessing  PA pressure.   3. The mitral valve is normal in structure. No evidence of mitral valve  regurgitation. No evidence of mitral stenosis.   4. The aortic valve is tricuspid. Aortic valve regurgitation is not  visualized. No aortic stenosis is present.   5. The inferior vena cava is normal in size with greater than 50%  respiratory variability, suggesting right atrial pressure of 3 mmHg.   6. Aortic root measures 3.6 cm. This is within normal limits for age,  gender, and BSA.    Risk Assessment/Calculations:    CHA2DS2-VASc Score = 0   This indicates a 0.2% annual risk of stroke. The patient's score is based upon: CHF History: 0 HTN History: 0  Diabetes History: 0 Stroke History: 0 Vascular Disease History: 0 Age Score: 0 Gender Score: 0    Physical Exam:   VS:  BP 108/70   Pulse 98   Ht 5\' 9"  (1.753 m)   Wt 222 lb 9.6 oz (101 kg)   SpO2 94%   BMI 32.87 kg/m    Wt Readings from Last 3 Encounters:  02/22/23 222 lb 9.6 oz (101 kg)  01/22/23 222 lb (100.7 kg)  12/15/22 221 lb 8 oz (100.5 kg)     GEN: Well nourished, well developed male appearing in no acute distress NECK: No  JVD; No carotid bruits CARDIAC: RRR, no murmurs, rubs, gallops RESPIRATORY:  Clear to auscultation without rales, wheezing or rhonchi  ABDOMEN: Appears non-distended. No obvious abdominal masses. EXTREMITIES: No clubbing or cyanosis. No pitting edema.  Distal pedal pulses are 2+ bilaterally.   Assessment and Plan:   1. Paroxysmal Atrial Fibrillation - He was previously followed by the [facility] Clinic and overall doing well on his current regimen at that time with Multaq 400 mg twice daily and Cardizem CD 120 mg daily. This was not refilled due to the timeframe since his last office visit and he has experienced recurrent palpitations in the interim. We reviewed options in regards to restarting his prior medication regimen with Multaq or referring to EP for consideration of ablation and he wishes to restart medical therapy initially. Will prescribe Multaq 400 mg twice daily.  Recheck TSH and LFT's within 2 to 3 months. I encouraged him to make [place] aware if symptoms persist as we can arrange for a Zio patch to reassess his arrhythmia burden. - He has not been on anticoagulation given his CHA2DS2-VASc score of 0.  2. HLD - [facility] and he reports he has been off Atorvastatin 20 mg daily. He is scheduled to see them later this month with follow-up labs at that time.   Signed, Ellsworth Lennox, PA-C

## 2023-02-22 NOTE — Patient Instructions (Addendum)
Medication Instructions:  Your physician has recommended you make the following change in your medication:  Restart multaq 400 mg twice daily Continue all other medications as prescribed  Labwork: TSH & LFT'S in 2-3 months Non-fasting [facility]  Testing/Procedures: none  Follow-Up: Your physician recommends that you schedule a follow-up appointment in: 4-5 months with Dr. Diona Browner or [place]  Any Other Special Instructions Will Be Listed Below (If Applicable).  If you need a refill on your cardiac medications before your next appointment, please call your pharmacy.

## 2023-03-07 DIAGNOSIS — K122 Cellulitis and abscess of mouth: Secondary | ICD-10-CM | POA: Diagnosis not present

## 2023-03-29 IMAGING — DX DG LUMBAR SPINE COMPLETE 4+V
5 series · 5 of 5 positions shown · non-contrast
Comparison: No priors.

CLINICAL DATA: 54-year-old male with history of back pain.

EXAM:
LUMBAR SPINE - COMPLETE 4+ VIEW

[l-spine ap]
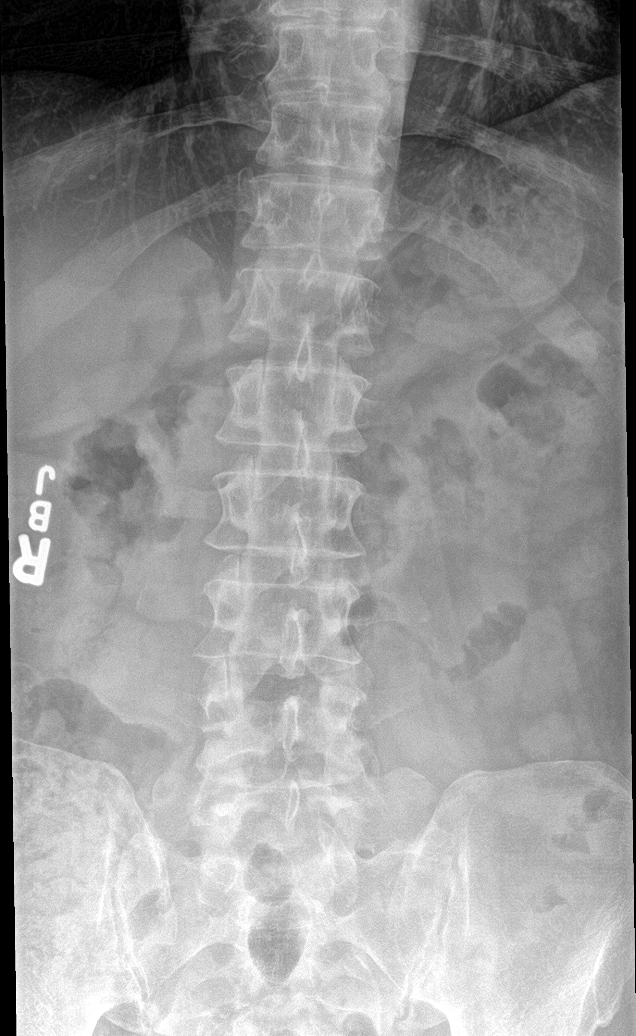

[l-spine obl (1 of 2)]
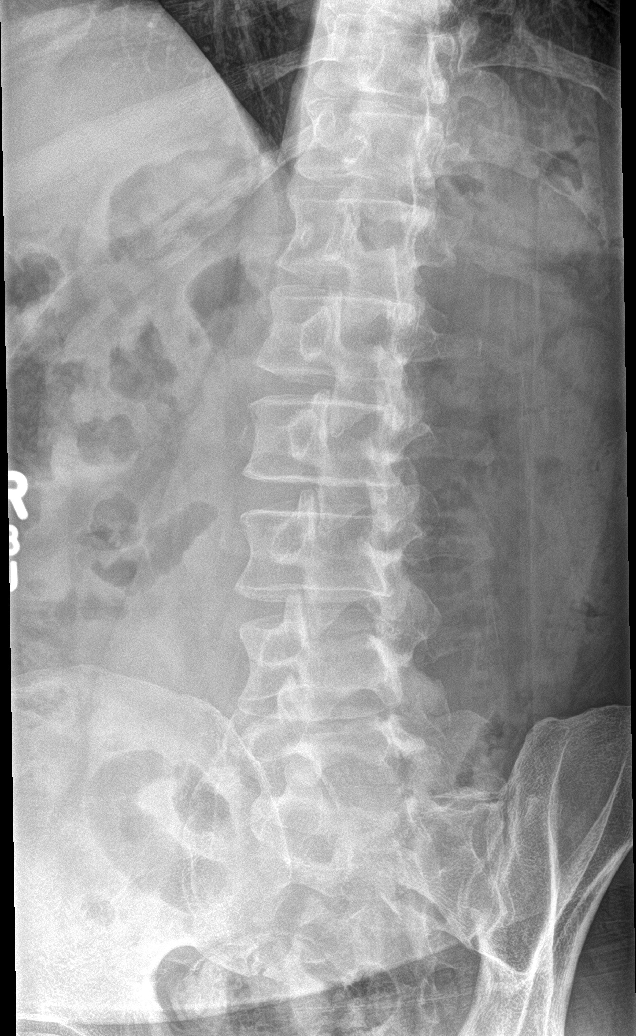

[l-spine obl (2 of 2)]
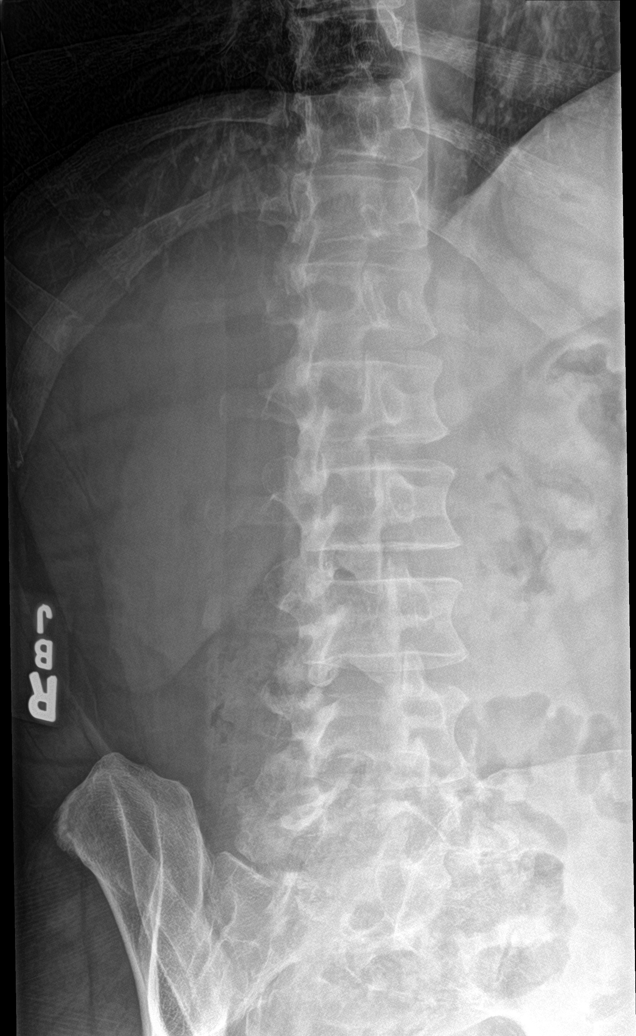

[l-spine lat]
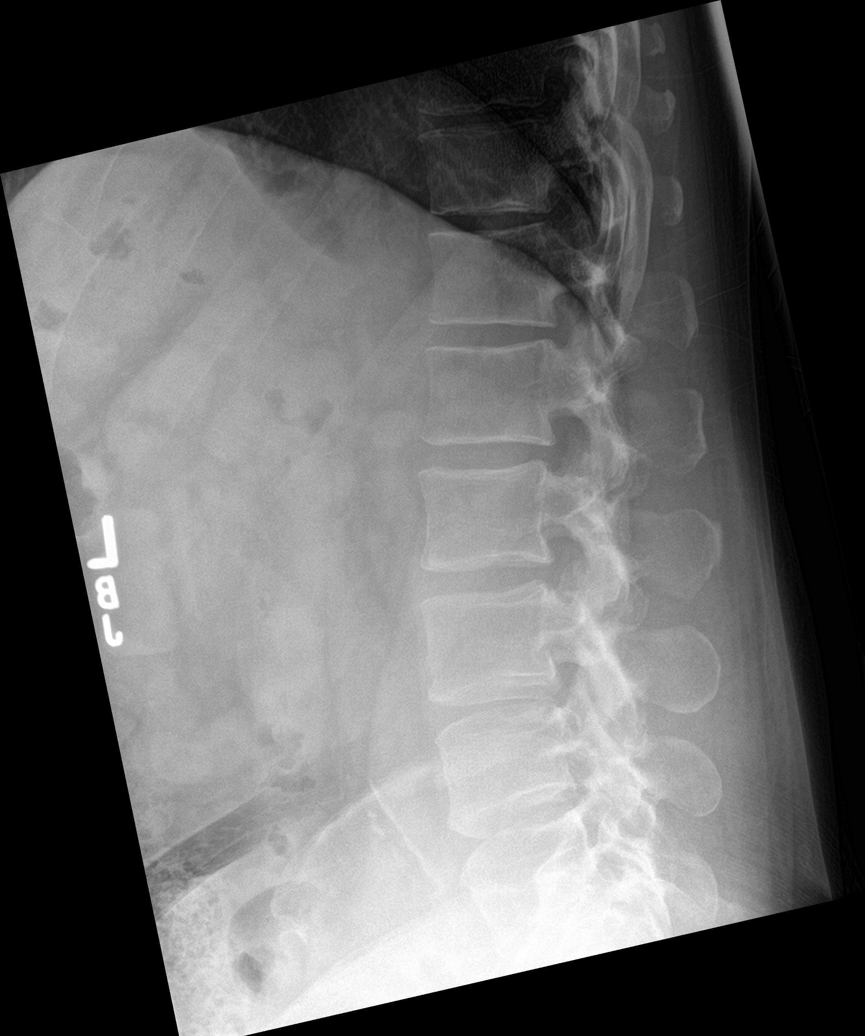

[l-spine spot]
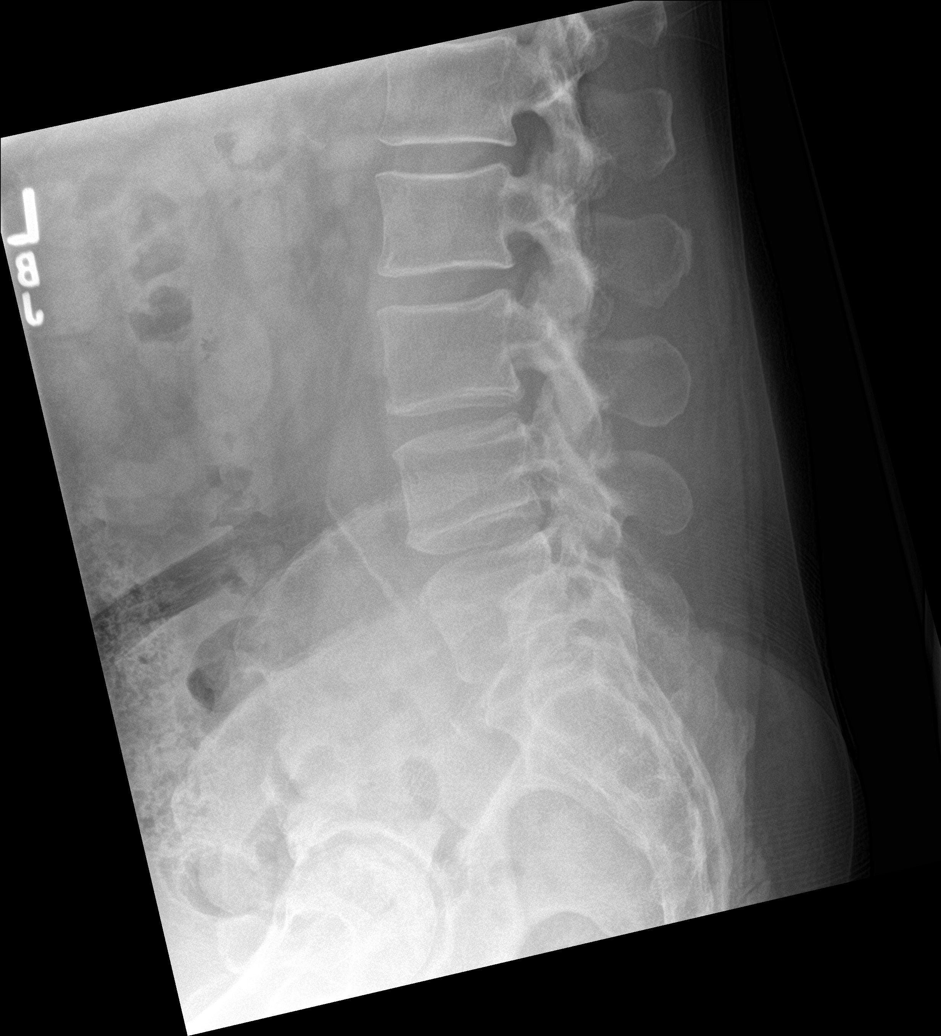

[5 of 5 positions shown; findings below may reference images not displayed]

FINDINGS: There is no evidence of lumbar spine fracture. Alignment is normal.
Intervertebral disc spaces are maintained.
IMPRESSION: Negative.

## 2023-04-04 ENCOUNTER — Ambulatory Visit: Payer: No Typology Code available for payment source | Admitting: Student

## 2023-04-06 DIAGNOSIS — K529 Noninfective gastroenteritis and colitis, unspecified: Secondary | ICD-10-CM | POA: Diagnosis not present

## 2023-06-19 ENCOUNTER — Ambulatory Visit (INDEPENDENT_AMBULATORY_CARE_PROVIDER_SITE_OTHER): Payer: No Typology Code available for payment source | Admitting: Gastroenterology

## 2023-06-26 ENCOUNTER — Ambulatory Visit: Payer: No Typology Code available for payment source | Admitting: Cardiology

## 2023-06-29 ENCOUNTER — Ambulatory Visit: Payer: No Typology Code available for payment source | Attending: Cardiology | Admitting: Cardiology

## 2023-12-27 ENCOUNTER — Encounter (INDEPENDENT_AMBULATORY_CARE_PROVIDER_SITE_OTHER): Payer: Self-pay | Admitting: Gastroenterology

## 2024-02-24 ENCOUNTER — Other Ambulatory Visit: Payer: Self-pay | Admitting: Student

## 2024-04-18 ENCOUNTER — Ambulatory Visit: Admitting: Cardiology
